# Patient Record
Sex: Male | Born: 1960 | Race: White | Hispanic: No | Marital: Married | Smoking: Former smoker
Health system: Southern US, Community
[De-identification: ages and names within clinical notes are randomized; demographics above are authoritative.]

## PROBLEM LIST (undated history)

## (undated) DIAGNOSIS — Z973 Presence of spectacles and contact lenses: Secondary | ICD-10-CM

## (undated) DIAGNOSIS — M1990 Unspecified osteoarthritis, unspecified site: Secondary | ICD-10-CM

## (undated) HISTORY — PX: COLONOSCOPY: SHX174

---

## 1986-08-21 HISTORY — PX: BURR HOLE FOR SUBDURAL HEMATOMA: SHX1275

## 1986-08-21 HISTORY — PX: ORIF TIBIA FRACTURE: SHX5416

## 1998-03-19 ENCOUNTER — Emergency Department (HOSPITAL_COMMUNITY): Admission: EM | Admit: 1998-03-19 | Discharge: 1998-03-19 | Payer: Self-pay | Admitting: Endocrinology

## 2013-10-31 ENCOUNTER — Other Ambulatory Visit: Payer: Self-pay | Admitting: Physician Assistant

## 2013-11-04 ENCOUNTER — Encounter (HOSPITAL_BASED_OUTPATIENT_CLINIC_OR_DEPARTMENT_OTHER): Payer: Self-pay | Admitting: *Deleted

## 2013-11-04 NOTE — Progress Notes (Signed)
No health issues-no meds

## 2013-11-07 ENCOUNTER — Encounter (HOSPITAL_BASED_OUTPATIENT_CLINIC_OR_DEPARTMENT_OTHER)
Admission: RE | Disposition: A | Discharge status: Home / Self Care | Payer: Self-pay | Source: Ambulatory Visit | Attending: Orthopedic Surgery

## 2013-11-07 ENCOUNTER — Ambulatory Visit (HOSPITAL_BASED_OUTPATIENT_CLINIC_OR_DEPARTMENT_OTHER)
Admission: RE | Admit: 2013-11-07 | Discharge: 2013-11-07 | Disposition: A | Discharge status: Home / Self Care | Payer: 59 | Source: Ambulatory Visit | Attending: Orthopedic Surgery | Admitting: Orthopedic Surgery

## 2013-11-07 ENCOUNTER — Ambulatory Visit (HOSPITAL_BASED_OUTPATIENT_CLINIC_OR_DEPARTMENT_OTHER): Payer: 59 | Admitting: Anesthesiology

## 2013-11-07 ENCOUNTER — Encounter (HOSPITAL_BASED_OUTPATIENT_CLINIC_OR_DEPARTMENT_OTHER): Payer: 59 | Admitting: Anesthesiology

## 2013-11-07 ENCOUNTER — Encounter (HOSPITAL_BASED_OUTPATIENT_CLINIC_OR_DEPARTMENT_OTHER): Payer: Self-pay | Admitting: *Deleted

## 2013-11-07 DIAGNOSIS — M67919 Unspecified disorder of synovium and tendon, unspecified shoulder: Secondary | ICD-10-CM | POA: Insufficient documentation

## 2013-11-07 DIAGNOSIS — Z87891 Personal history of nicotine dependence: Secondary | ICD-10-CM | POA: Insufficient documentation

## 2013-11-07 DIAGNOSIS — M19019 Primary osteoarthritis, unspecified shoulder: Secondary | ICD-10-CM | POA: Insufficient documentation

## 2013-11-07 DIAGNOSIS — M719 Bursopathy, unspecified: Secondary | ICD-10-CM | POA: Insufficient documentation

## 2013-11-07 DIAGNOSIS — S43439A Superior glenoid labrum lesion of unspecified shoulder, initial encounter: Secondary | ICD-10-CM | POA: Insufficient documentation

## 2013-11-07 DIAGNOSIS — X58XXXA Exposure to other specified factors, initial encounter: Secondary | ICD-10-CM | POA: Insufficient documentation

## 2013-11-07 HISTORY — DX: Presence of spectacles and contact lenses: Z97.3

## 2013-11-07 HISTORY — DX: Unspecified osteoarthritis, unspecified site: M19.90

## 2013-11-07 HISTORY — PX: SHOULDER ARTHROSCOPY WITH SUBACROMIAL DECOMPRESSION AND BICEP TENDON REPAIR: SHX5689

## 2013-11-07 LAB — POCT HEMOGLOBIN-HEMACUE: Hemoglobin: 14.8 g/dL (ref 13.0–17.0)

## 2013-11-07 SURGERY — SHOULDER ARTHROSCOPY WITH SUBACROMIAL DECOMPRESSION AND BICEP TENDON REPAIR
Anesthesia: General | Site: Shoulder | Laterality: Right

## 2013-11-07 MED ORDER — ONDANSETRON HCL 4 MG PO TABS: 4.0000 mg | ORAL_TABLET | Freq: Three times a day (TID) | ORAL | Status: DC | PRN

## 2013-11-07 MED ORDER — BUPIVACAINE-EPINEPHRINE PF 0.5-1:200000 % IJ SOLN
INTRAMUSCULAR | Status: DC | PRN
  Administered 2013-11-07: 25 mL

## 2013-11-07 MED ORDER — PROPOFOL 10 MG/ML IV BOLUS
INTRAVENOUS | Status: DC | PRN
  Administered 2013-11-07: 160 mg via INTRAVENOUS

## 2013-11-07 MED ORDER — OXYCODONE HCL 5 MG PO TABS: 10.0000 mg | ORAL_TABLET | ORAL | Status: DC | PRN

## 2013-11-07 MED ORDER — FENTANYL CITRATE 0.05 MG/ML IJ SOLN
50.0000 ug | INTRAMUSCULAR | Status: DC | PRN
  Administered 2013-11-07: 100 ug via INTRAVENOUS

## 2013-11-07 MED ORDER — LACTATED RINGERS IV SOLN
INTRAVENOUS | Status: DC
  Administered 2013-11-07: 13:00:00 via INTRAVENOUS

## 2013-11-07 MED ORDER — ONDANSETRON HCL 4 MG/2ML IJ SOLN
INTRAMUSCULAR | Status: DC | PRN
  Administered 2013-11-07: 4 mg via INTRAVENOUS

## 2013-11-07 MED ORDER — LACTATED RINGERS IV SOLN: INTRAVENOUS | Status: DC

## 2013-11-07 MED ORDER — MIDAZOLAM HCL 2 MG/2ML IJ SOLN: 1.0000 mg | INTRAMUSCULAR | Status: DC | PRN

## 2013-11-07 MED ORDER — GLYCOPYRROLATE 0.2 MG/ML IJ SOLN
INTRAMUSCULAR | Status: DC | PRN
  Administered 2013-11-07: 0.2 mg via INTRAVENOUS

## 2013-11-07 MED ORDER — HYDROMORPHONE HCL PF 1 MG/ML IJ SOLN: 0.2500 mg | INTRAMUSCULAR | Status: DC | PRN

## 2013-11-07 MED ORDER — OXYCODONE HCL 5 MG PO TABS: 5.0000 mg | ORAL_TABLET | Freq: Once | ORAL | Status: DC | PRN

## 2013-11-07 MED ORDER — DOCUSATE SODIUM 100 MG PO CAPS: 100.0000 mg | ORAL_CAPSULE | Freq: Two times a day (BID) | ORAL | Status: DC

## 2013-11-07 MED ORDER — MIDAZOLAM HCL 2 MG/2ML IJ SOLN
INTRAMUSCULAR | Status: AC
  Filled 2013-11-07: qty 2

## 2013-11-07 MED ORDER — LIDOCAINE HCL (CARDIAC) 20 MG/ML IV SOLN
INTRAVENOUS | Status: DC | PRN
  Administered 2013-11-07: 80 mg via INTRAVENOUS

## 2013-11-07 MED ORDER — CEFAZOLIN SODIUM-DEXTROSE 2-3 GM-% IV SOLR
2.0000 g | INTRAVENOUS | Status: AC
  Administered 2013-11-07: 2 g via INTRAVENOUS

## 2013-11-07 MED ORDER — CHLORHEXIDINE GLUCONATE 4 % EX LIQD: 60.0000 mL | Freq: Once | CUTANEOUS | Status: DC

## 2013-11-07 MED ORDER — DEXAMETHASONE SODIUM PHOSPHATE 4 MG/ML IJ SOLN
INTRAMUSCULAR | Status: DC | PRN
  Administered 2013-11-07: 5 mg via INTRAVENOUS

## 2013-11-07 MED ORDER — DEXAMETHASONE SODIUM PHOSPHATE 10 MG/ML IJ SOLN
INTRAMUSCULAR | Status: DC | PRN
  Administered 2013-11-07: 4 mg

## 2013-11-07 MED ORDER — FENTANYL CITRATE 0.05 MG/ML IJ SOLN: 50.0000 ug | Freq: Once | INTRAMUSCULAR | Status: DC

## 2013-11-07 MED ORDER — OXYCODONE HCL 5 MG/5ML PO SOLN
5.0000 mg | Freq: Once | ORAL | Status: DC | PRN
Start: 2013-11-07 — End: 2013-11-07

## 2013-11-07 MED ORDER — SODIUM CHLORIDE 0.9 % IR SOLN
Status: DC | PRN
  Administered 2013-11-07: 8000 mL

## 2013-11-07 MED ORDER — FENTANYL CITRATE 0.05 MG/ML IJ SOLN
INTRAMUSCULAR | Status: AC
  Filled 2013-11-07: qty 2

## 2013-11-07 MED ORDER — SUCCINYLCHOLINE CHLORIDE 20 MG/ML IJ SOLN
INTRAMUSCULAR | Status: DC | PRN
  Administered 2013-11-07: 100 mg via INTRAVENOUS

## 2013-11-07 MED ORDER — ASPIRIN 81 MG PO TABS: 81.0000 mg | ORAL_TABLET | Freq: Every day | ORAL | Status: DC

## 2013-11-07 MED ORDER — MIDAZOLAM HCL 2 MG/2ML IJ SOLN
1.0000 mg | INTRAMUSCULAR | Status: DC | PRN
  Administered 2013-11-07: 2 mg via INTRAVENOUS

## 2013-11-07 MED ORDER — PROMETHAZINE HCL 25 MG/ML IJ SOLN: 6.2500 mg | INTRAMUSCULAR | Status: DC | PRN

## 2013-11-07 SURGICAL SUPPLY — 74 items
APL SKNCLS STERI-STRIP NONHPOA (GAUZE/BANDAGES/DRESSINGS)
BENZOIN TINCTURE PRP APPL 2/3 (GAUZE/BANDAGES/DRESSINGS) IMPLANT
BLADE AVERAGE 25MMX9MM (BLADE)
BLADE AVERAGE 25X9 (BLADE) IMPLANT
BLADE CUTTER GATOR 3.5 (BLADE) ×3 IMPLANT
BLADE CUTTER MENIS 5.5 (BLADE) IMPLANT
BLADE GREAT WHITE 4.2 (BLADE) IMPLANT
BLADE GREAT WHITE 4.2MM (BLADE)
BLADE SURG 15 STRL LF DISP TIS (BLADE) IMPLANT
BLADE SURG 15 STRL SS (BLADE)
BUR OVAL 4.0 (BURR) IMPLANT
BUR OVAL 6.0 (BURR) ×3 IMPLANT
CANISTER SUCT 3000ML (MISCELLANEOUS) IMPLANT
CANNULA 5.75X71 LONG (CANNULA) ×3 IMPLANT
CANNULA TWIST IN 8.25X7CM (CANNULA) ×3 IMPLANT
CLOSURE WOUND 1/2 X4 (GAUZE/BANDAGES/DRESSINGS)
DRAPE STERI 35X30 U-POUCH (DRAPES) ×3 IMPLANT
DRAPE U 20/CS (DRAPES) ×3 IMPLANT
DRAPE U-SHAPE 47X51 STRL (DRAPES) ×3 IMPLANT
DRAPE U-SHAPE 76X120 STRL (DRAPES) ×6 IMPLANT
DRSG EMULSION OIL 3X3 NADH (GAUZE/BANDAGES/DRESSINGS) ×3 IMPLANT
DRSG PAD ABDOMINAL 8X10 ST (GAUZE/BANDAGES/DRESSINGS) ×3 IMPLANT
DURAPREP 26ML APPLICATOR (WOUND CARE) ×3 IMPLANT
ELECT NDL TIP 2.8 STRL (NEEDLE) IMPLANT
ELECT NEEDLE TIP 2.8 STRL (NEEDLE) IMPLANT
ELECT REM PT RETURN 9FT ADLT (ELECTROSURGICAL)
ELECTRODE REM PT RTRN 9FT ADLT (ELECTROSURGICAL) IMPLANT
GLOVE BIO SURGEON STRL SZ7.5 (GLOVE) ×3 IMPLANT
GLOVE BIO SURGEON STRL SZ8 (GLOVE) IMPLANT
GLOVE BIOGEL PI IND STRL 8 (GLOVE) ×2 IMPLANT
GLOVE BIOGEL PI INDICATOR 8 (GLOVE) ×4
GOWN STRL REUS W/ TWL LRG LVL3 (GOWN DISPOSABLE) ×3 IMPLANT
GOWN STRL REUS W/ TWL XL LVL3 (GOWN DISPOSABLE) ×1 IMPLANT
GOWN STRL REUS W/TWL LRG LVL3 (GOWN DISPOSABLE) ×9
GOWN STRL REUS W/TWL XL LVL3 (GOWN DISPOSABLE) ×3
MANIFOLD NEPTUNE II (INSTRUMENTS) ×3 IMPLANT
NDL SCORPION MULTI FIRE (NEEDLE) IMPLANT
NDL SUT 6 .5 CRC .975X.05 MAYO (NEEDLE) IMPLANT
NEEDLE MAYO TAPER (NEEDLE)
NEEDLE SCORPION MULTI FIRE (NEEDLE) IMPLANT
NS IRRIG 1000ML POUR BTL (IV SOLUTION) IMPLANT
PACK ARTHROSCOPY DSU (CUSTOM PROCEDURE TRAY) ×3 IMPLANT
PACK BASIN DAY SURGERY FS (CUSTOM PROCEDURE TRAY) ×3 IMPLANT
PENCIL BUTTON HOLSTER BLD 10FT (ELECTRODE) ×3 IMPLANT
SET ARTHROSCOPY TUBING (MISCELLANEOUS) ×3
SET ARTHROSCOPY TUBING LN (MISCELLANEOUS) ×1 IMPLANT
SLEEVE SCD COMPRESS KNEE MED (MISCELLANEOUS) ×3 IMPLANT
SLING ARM IMMOBILIZER LRG (SOFTGOODS) IMPLANT
SLING ARM IMMOBILIZER MED (SOFTGOODS) IMPLANT
SLING ARM LRG ADULT FOAM STRAP (SOFTGOODS) IMPLANT
SLING ARM MED ADULT FOAM STRAP (SOFTGOODS) IMPLANT
SLING ARM XL FOAM STRAP (SOFTGOODS) IMPLANT
SPONGE GAUZE 4X4 12PLY (GAUZE/BANDAGES/DRESSINGS) ×6 IMPLANT
SPONGE GAUZE 4X4 12PLY STER LF (GAUZE/BANDAGES/DRESSINGS) ×2 IMPLANT
SPONGE LAP 4X18 X RAY DECT (DISPOSABLE) IMPLANT
STRIP CLOSURE SKIN 1/2X4 (GAUZE/BANDAGES/DRESSINGS) IMPLANT
SUCTION FRAZIER TIP 10 FR DISP (SUCTIONS) IMPLANT
SUT ETHIBOND 2 OS 4 DA (SUTURE) IMPLANT
SUT ETHILON 2 0 FS 18 (SUTURE) IMPLANT
SUT ETHILON 3 0 PS 1 (SUTURE) ×3 IMPLANT
SUT FIBERWIRE #2 38 T-5 BLUE (SUTURE)
SUT TIGER TAPE 7 IN WHITE (SUTURE) IMPLANT
SUT VIC AB 0 CT1 27 (SUTURE)
SUT VIC AB 0 CT1 27XBRD ANBCTR (SUTURE) IMPLANT
SUT VIC AB 2-0 SH 27 (SUTURE)
SUT VIC AB 2-0 SH 27XBRD (SUTURE) IMPLANT
SUT VIC AB 3-0 FS2 27 (SUTURE) IMPLANT
SUTURE FIBERWR #2 38 T-5 BLUE (SUTURE) IMPLANT
TAPE FIBER 2MM 7IN #2 BLUE (SUTURE) IMPLANT
TOWEL OR 17X24 6PK STRL BLUE (TOWEL DISPOSABLE) ×3 IMPLANT
TOWEL OR NON WOVEN STRL DISP B (DISPOSABLE) ×3 IMPLANT
WAND STAR VAC 90 (SURGICAL WAND) ×3 IMPLANT
WATER STERILE IRR 1000ML POUR (IV SOLUTION) ×3 IMPLANT
YANKAUER SUCT BULB TIP NO VENT (SUCTIONS) IMPLANT

## 2013-11-07 NOTE — Discharge Instructions (Signed)
Take ASA 81mg  daily for 30 days  Use your sling for comfort  Remove your dressing on Monday and replace with bandaid's. You may shower on Monday  Discharge Instructions After Orthopedic Procedures:  *You may feel tired and weak following your procedure. It is recommended that you limit physical activity for the next 24 hours and rest at home for the remainder of today and tomorrow. *No strenuous activity should be started without your doctor's permission.  Elevate the extremity that you had surgery on to a level above your heart. This should continue for 48 hours or as instructed by your doctor.  If you had hand, arm or shoulder surgery you should move your fingers frequently unless otherwise instructed by your doctor.  If you had foot, knee or leg surgery you should wiggle your toes frequently unless otherwise instructed by your doctor.  Follow your doctor's exact instructions for activity at home. Use your home equipment as instructed. (Crutches, hard shoes, slings etc.)  Limit your activity as instructed by your doctor.  Report to your doctor should any of the following occur: 1. Extreme swelling of your fingers or toes. 2. Inability to wiggle your fingers or toes. 3. Coldness, pale or bluish color in your fingers or toes. 4. Loss of sensation, numbness or tingling of your fingers or toes. 5. Unusual smell or odor from under your dressing or cast. 6. Excessive bleeding or drainage from the surgical site. 7. Pain not relieved by medication your doctor has prescribed for you. 8. Cast or dressing too tight (do not get your dressing or cast wet or put anything under          your dressing or cast.)  *Do not change your dressing unless instructed by your doctor or discharge nurse. Then follow exact instructions.  *Follow labeled instructions for any medications that your doctor may have prescribed for you. *Should any questions or complications develop following your procedure, PLEASE  CONTACT YOUR DOCTOR.  Regional Anesthesia Blocks  1. Numbness or the inability to move the "blocked" extremity may last from 3-48 hours after placement. The length of time depends on the medication injected and your individual response to the medication. If the numbness is not going away after 48 hours, call your surgeon.  2. The extremity that is blocked will need to be protected until the numbness is gone and the  Strength has returned. Because you cannot feel it, you will need to take extra care to avoid injury. Because it may be weak, you may have difficulty moving it or using it. You may not know what position it is in without looking at it while the block is in effect.  3. For blocks in the legs and feet, returning to weight bearing and walking needs to be done carefully. You will need to wait until the numbness is entirely gone and the strength has returned. You should be able to move your leg and foot normally before you try and bear weight or walk. You will need someone to be with you when you first try to ensure you do not fall and possibly risk injury.  4. Bruising and tenderness at the needle site are common side effects and will resolve in a few days.  5. Persistent numbness or new problems with movement should be communicated to the surgeon or the [facility] [phone] [facility] [phone]).  Post Anesthesia Home Care Instructions  Activity: Get plenty of rest for the remainder of the  day. A responsible adult should stay with you for 24 hours following the procedure.  For the next 24 hours, DO NOT: -Drive a car -Advertising copywriterperate machinery -Drink alcoholic beverages -Take any medication unless instructed by your physician -Make any legal decisions or sign important papers.  Meals: Start with liquid foods such as gelatin or soup. Progress to regular foods as tolerated. Avoid greasy, spicy, heavy foods. If nausea and/or vomiting occur, drink only clear  liquids until the nausea and/or vomiting subsides. Call your physician if vomiting continues.  Special Instructions/Symptoms: Your throat may feel dry or sore from the anesthesia or the breathing tube placed in your throat during surgery. If this causes discomfort, gargle with warm salt water. The discomfort should disappear within 24 hours.

## 2013-11-07 NOTE — Anesthesia Postprocedure Evaluation (Signed)
  Anesthesia Post-op Note  Patient: Shawn Lane  Procedure(s) Performed: Procedure(s): RIGHT SHOULDER ARTHROSCOPY DEBRIDEMENT EXTENSIVE WITH SUBACROMIAL DECOMPRESSION PARTIAL ACROMIOPLASTY ,DISTAL CLAVICLE EXCISION CORACPACROMIAL RELEASE, DISTAL CLAVICULECTOMY,AND BICEP TENODESIS (Right)  Patient Location: PACU  Anesthesia Type:GA combined with regional for post-op pain  Level of Consciousness: awake and alert   Airway and Oxygen Therapy: Patient Spontanous Breathing  Post-op Pain: none  Post-op Assessment: Post-op Vital signs reviewed  Post-op Vital Signs: stable  Complications: No apparent anesthesia complications

## 2013-11-07 NOTE — Anesthesia Preprocedure Evaluation (Addendum)
Anesthesia Evaluation  Patient identified by MRN, date of birth, ID band Patient awake    Reviewed: Allergy & Precautions, H&P , NPO status , Patient's Chart, lab work & pertinent test results  Airway Mallampati: I TM Distance: >3 FB Neck ROM: Full    Dental   Pulmonary former smoker,  breath sounds clear to auscultation        Cardiovascular Rhythm:Regular Rate:Normal     Neuro/Psych    GI/Hepatic   Endo/Other    Renal/GU      Musculoskeletal   Abdominal   Peds  Hematology   Anesthesia Other Findings   Reproductive/Obstetrics                          Anesthesia Physical Anesthesia Plan  ASA: II  Anesthesia Plan: General   Post-op Pain Management:    Induction: Intravenous  Airway Management Planned: Oral ETT  Additional Equipment:   Intra-op Plan:   Post-operative Plan: Extubation in OR  Informed Consent: I have reviewed the patients History and Physical, chart, labs and discussed the procedure including the risks, benefits and alternatives for the proposed anesthesia with the patient or authorized representative who has indicated his/her understanding and acceptance.     Plan Discussed with: CRNA and Surgeon  Anesthesia Plan Comments:         Anesthesia Quick Evaluation

## 2013-11-07 NOTE — Op Note (Signed)
11/07/2013  2:49 PM  PATIENT:  Shawn Lane [place]    PRE-OPERATIVE DIAGNOSIS:  RIGHT SHOULDER DISORDER ARTICULAR CARTILAGE, DEGENERATIVE ARTHRITIS, DISORDERS OF BURSAE AND TENDONS IN SHOULDER REGION, UNSPECIFIED, BICIPTAL  POST-OPERATIVE DIAGNOSIS:  Same  PROCEDURE:  RIGHT SHOULDER ARTHROSCOPY DEBRIDEMENT EXTENSIVE WITH SUBACROMIAL DECOMPRESSION PARTIAL ACROMIOPLASTY WITH CORACPACROMIAL RELEASE, DISTAL CLAVICULECTOMY,AND BICEP TENODESIS  SURGEON:  Madigan Rosensteel, D, MD  ASSISTANT: Janace LittenBrandon Parry OPA  ANESTHESIA:   General  PREOPERATIVE INDICATIONS:  Shawn Lane Sabia is Lane  53 y.o. male with Lane diagnosis of RIGHT SHOULDER DISORDER ARTICULAR CARTILAGE, DEGENERATIVE ARTHRITIS, DISORDERS OF BURSAE AND TENDONS IN SHOULDER REGION, UNSPECIFIED, BICIPTAL who failed conservative measures and elected for surgical management.    The risks benefits and alternatives were discussed with the patient preoperatively including but not limited to the risks of infection, bleeding, nerve injury, cardiopulmonary complications, the need for revision surgery, among others, and the patient was willing to proceed.  OPERATIVE IMPLANTS: none  OPERATIVE FINDINGS: Limited range of motion to 50 and 90 of forward elevation improved after manipulation. Grade 1/2 SLAP tear. Significant clavicle overgrowth at the Lane.c. joint. No arthritic changes at the glenohumeral joint. Cuff intact. Post acromion.  BLOOD LOSS: minimal  COMPLICATIONS: none  OPERATIVE PROCEDURE:  Patient was identified in the preoperative holding area and site was marked by me He was transported to the operating theater and placed on the table in beach chair position taking care to pad all bony prominences. After Lane preincinduction time out anesthesia was induced. The right upper extremity was prepped and draped in normal sterile fashion and Lane pre-incision timeout was performed. Shawn Lane Verba received ancef for preoperative antibiotics.   I performed Lane firm but  gentle manipulation under anesthesia and had palpable breakup of scar tissue and improvement in range of motion.  Initially made Lane posterior arthroscopic portal and inserted the arthroscope into the glenohumeral joint. tour of the joint demonstrated the above operative findings  I created an anterior portal just lateral to the coracoid under direct visualization using Lane spinal needle.   I used Lane combination of biter and shaver to release the biceps tendon from the superior labrum and then used the shaver to debride the superior labrum to Lane smooth rim.  I then introduced the arthroscope into the subacromial space and brought the shaver into the anterior portal. I debrided the bursa for appropriate visualization.  I then performed Lane subacromial decompression using combination of the shaver ArthroCare and burr using Lane cutting block technique. As happy with the final elevation of the subacromial space on multiple portal views.  Next I turned my attention to the distal clavicle and through the anterior portal using the bur and shaver I was able to perform Lane distal clavicle excision. I then switched portals and inserted the arthroscope into the anterior portal and was happy with an appropriate resection of the distal clavicle.  Next I removed all arthroscopic equipment expressed all fluid and closed the portals with Lane nylon stitch. Lane sterile dressing was applied the patient was taken the PACU in stable condition.  POST OPERATIVE PLAN: The patient will be in Lane sling full-time and keep the dressings clean dry and intact. DVT prophylaxis will consist of early ambulation and ASA 81mg 

## 2013-11-07 NOTE — Progress Notes (Signed)
Assisted Dr. Kasik with right, ultrasound guided, interscalene  block. Side rails up, monitors on throughout procedure. See vital signs in flow sheet. Tolerated Procedure well. 
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
## 2013-11-07 NOTE — Interval H&P Note (Signed)
History and Physical Interval Note:  11/07/2013 12:54 PM  Shawn Lane  has presented today for surgery, with the diagnosis of RIGHT SHOULDER DISORDER ARTICULAR CARTILAGE, DEGENERATIVE ARTHRITIS, DISORDERS OF BURSAE AND TENDONS IN SHOULDER REGION, UNSPECIFIED, BICIPTAL  The various methods of treatment have been discussed with the patient and family. After consideration of risks, benefits and other options for treatment, the patient has consented to  Procedure(s): RIGHT SHOULDER ARTHROSCOPY DEBRIDEMENT EXTENSIVE WITH SUBACROMIAL DECOMPRESSION PARTIAL ACROMIOPLASTY WITH CORACPACROMIAL RELEASE, DISTAL CLAVICULECTOMY,AND BICEP TENODESIS (Right) as a surgical intervention .  The patient's history has been reviewed, patient examined, no change in status, stable for surgery.  I have reviewed the patient's chart and labs.  Questions were answered to the patient's satisfaction.     Lakrisha Iseman, D

## 2013-11-07 NOTE — Anesthesia Procedure Notes (Addendum)
Anesthesia Regional Block:  Interscalene brachial plexus block  Pre-Anesthetic Checklist: ,, timeout performed, Correct Patient, Correct Site, Correct Laterality, Correct Procedure, Correct Position, site marked, Risks and benefits discussed,  Surgical consent,  Pre-op evaluation,  At surgeon's request and post-op pain management  Laterality: Right  Prep: chloraprep       Needles:  Injection technique: Single-shot  Needle Type: Echogenic Stimulator Needle     Needle Length: 5cm 5 cm Needle Gauge: 22 and 22 G    Additional Needles:  Procedures: ultrasound guided (picture in chart) and nerve stimulator Interscalene brachial plexus block  Nerve Stimulator or Paresthesia:  Response: 0.5 mA,   Additional Responses:   Narrative:  Start time: 11/07/2013 12:57 PM End time: 11/07/2013 1:10 PM Injection made incrementally with aspirations every 5 mL. Anesthesiologist: Dr Gypsy BalsamKasik  Additional Notes: 1257-1310 R ISB POP CHG prep, sterile tech #22 stim/echo needle with stim down to .5ma and good US visualization-PIX in chart Multiple neg asp  Vernia BuffMarc .5% w/epi 1:200000 total 25cc+decadron 4mg  infiltrated No compl Dr Gypsy BalsamKasik   Procedure Name: Intubation Date/Time: 11/07/2013 1:54 PM Performed by: Burna CashONRAD, Caera Enwright C Pre-anesthesia Checklist: Patient identified, Emergency Drugs available, Suction available and Patient being monitored Patient Re-evaluated:Patient Re-evaluated prior to inductionOxygen Delivery Method: Circle System Utilized Preoxygenation: Pre-oxygenation with 100% oxygen Intubation Type: IV induction Ventilation: Mask ventilation without difficulty Laryngoscope Size: Mac and 3 Grade View: Grade I Tube type: Oral Tube size: 8.0 mm Number of attempts: 1 Airway Equipment and Method: stylet and oral airway Placement Confirmation: ETT inserted through vocal cords under direct vision,  positive ETCO2 and breath sounds checked- equal and bilateral Secured at: 22 cm Tube  secured with: Tape Dental Injury: Teeth and Oropharynx as per pre-operative assessment

## 2013-11-07 NOTE — Transfer of Care (Signed)
Immediate Anesthesia Transfer of Care Note  Patient: Shawn Lane  Procedure(s) Performed: Procedure(s): RIGHT SHOULDER ARTHROSCOPY DEBRIDEMENT EXTENSIVE WITH SUBACROMIAL DECOMPRESSION PARTIAL ACROMIOPLASTY ,DISTAL CLAVICLE EXCISION CORACPACROMIAL RELEASE, DISTAL CLAVICULECTOMY,AND BICEP TENODESIS (Right)  Patient Location: PACU  Anesthesia Type:GA combined with regional for post-op pain  Level of Consciousness: awake, sedated and patient cooperative  Airway & Oxygen Therapy: Patient Spontanous Breathing and Patient connected to face mask oxygen  Post-op Assessment: Report given to PACU RN and Post -op Vital signs reviewed and stable  Post vital signs: Reviewed and stable  Complications: No apparent anesthesia complications

## 2013-11-10 ENCOUNTER — Encounter (HOSPITAL_BASED_OUTPATIENT_CLINIC_OR_DEPARTMENT_OTHER): Payer: Self-pay | Admitting: Orthopedic Surgery

## 2015-10-20 ENCOUNTER — Other Ambulatory Visit: Payer: Self-pay | Admitting: Family Medicine

## 2015-10-20 DIAGNOSIS — R109 Unspecified abdominal pain: Secondary | ICD-10-CM

## 2015-10-21 ENCOUNTER — Ambulatory Visit
Admission: RE | Admit: 2015-10-21 | Discharge: 2015-10-21 | Disposition: A | Discharge status: Home / Self Care | Payer: Self-pay | Source: Ambulatory Visit | Attending: Family Medicine | Admitting: Family Medicine

## 2015-10-21 DIAGNOSIS — R109 Unspecified abdominal pain: Secondary | ICD-10-CM

## 2017-03-20 ENCOUNTER — Ambulatory Visit (INDEPENDENT_AMBULATORY_CARE_PROVIDER_SITE_OTHER): Payer: 59 | Admitting: Allergy and Immunology

## 2017-03-20 ENCOUNTER — Encounter: Payer: Self-pay | Admitting: Allergy and Immunology

## 2017-03-20 DIAGNOSIS — J3089 Other allergic rhinitis: Secondary | ICD-10-CM

## 2017-03-20 DIAGNOSIS — Z91018 Allergy to other foods: Secondary | ICD-10-CM | POA: Diagnosis not present

## 2017-03-20 MED ORDER — FLUTICASONE PROPIONATE 93 MCG/ACT NA EXHU: 2.0000 | INHALANT_SUSPENSION | Freq: Two times a day (BID) | NASAL | 5 refills | Status: AC

## 2017-03-20 MED ORDER — LEVOCETIRIZINE DIHYDROCHLORIDE 5 MG PO TABS
5.0000 mg | ORAL_TABLET | Freq: Every evening | ORAL | 5 refills | Status: AC
Start: 2017-03-20 — End: ?

## 2017-03-20 NOTE — Patient Instructions (Addendum)
Allergic rhinitis Perennial and seasonal allergic rhinitis with a possible nonallergic component.  Aeroallergen avoidance measures have been discussed and provided in written form.  For runny nose and sneezing A prescription has been provided for levocetirizine, 5mg  daily as needed.  [facility], 2 actuations per nostril twice a day. Proper technique has been discussed and demonstrated.  Nasal saline lavage (NeilMed) has been recommended as needed and prior to medicated nasal sprays along with instructions for proper administration.  For thick post nasal drainage, add guaifenesin 1200 mg (Mucinex Maximum Strength)  twice daily as needed with adequate hydration as discussed.  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   Should symptoms progress beyond nasal congestion, a journal is to be kept recording any foods eaten, beverages consumed, medications taken, activities performed, and environmental conditions within a 6 hour time period prior to the onset of symptoms. For any symptoms concerning for anaphylaxis, 911 is to be called immediately.    Return in about 4 months (around 07/20/2017), or if symptoms worsen or fail to improve.   Reducing Pollen Exposure  The American Academy of Allergy, Asthma and Immunology suggests the following steps to reduce your exposure to pollen during allergy seasons.    1. Do not hang sheets or clothing out to dry; pollen may collect on these items. 2. Do not mow lawns or spend time around freshly cut grass; mowing stirs up pollen. 3. Keep windows closed at night.  Keep car windows closed while driving. 4. Minimize morning activities outdoors, a time when pollen counts are usually at their highest. 5. Stay indoors as much as possible when pollen counts or humidity is high and on windy days when pollen tends to remain in the air longer. 6. Use air conditioning  when possible.  Many air conditioners have filters that trap the pollen spores. 7. Use a HEPA room air filter to remove pollen form the indoor air you breathe.    Control of Mold Allergen  Mold and fungi can grow on a variety of surfaces provided certain temperature and moisture conditions exist.  Outdoor molds grow on plants, decaying vegetation and soil.  The major outdoor mold, Alternaria and Cladosporium, are found in very high numbers during hot and dry conditions.  Generally, a late Summer - Fall peak is seen for common outdoor fungal spores.  Rain will temporarily lower outdoor mold spore count, but counts rise rapidly when the rainy period ends.  The most important indoor molds are Aspergillus and Penicillium.  Dark, humid and poorly ventilated basements are ideal sites for mold growth.  The next most common sites of mold growth are the bathroom and the kitchen.  Outdoor MicrosoftMold Control 1. Use air conditioning and keep windows closed 2. Avoid exposure to decaying vegetation. 3. Avoid leaf raking. 4. Avoid grain handling. 5. Consider wearing a face mask if working in moldy areas.  Indoor Mold Control 1. Maintain humidity below 50%. 2. Clean washable surfaces with 5% bleach solution. 3. Remove sources e.g. Contaminated carpets.  Control of Dog or Cat Allergen  Avoidance is the best way to manage a dog or cat allergy. If you have a dog or cat and are allergic to dog or cats, consider removing the dog or cat from the home. If you have a dog or cat but don't want to find it a new home, or if your family wants a pet even though someone in  the household is allergic, here are some strategies that may help keep symptoms at bay:  1. Keep the pet out of your bedroom and restrict it to only a few rooms. Be advised that keeping the dog or cat in only one room will not limit the allergens to that room. 2. Don't pet, hug or kiss the dog or cat; if you do, wash your hands with soap and  water. 3. High-efficiency particulate air (HEPA) cleaners run continuously in a bedroom or living room can reduce allergen levels over time. 4. Place electrostatic material sheet in the air inlet vent in the bedroom. 5. Regular use of a high-efficiency vacuum cleaner or a central vacuum can reduce allergen levels. 6. Giving your dog or cat a bath at least once a week can reduce airborne allergen.

## 2017-03-20 NOTE — Assessment & Plan Note (Signed)
Perennial and seasonal allergic rhinitis with a possible nonallergic component.  Aeroallergen avoidance measures have been discussed and provided in written form.  For runny nose and sneezing A prescription has been provided for levocetirizine, 5mg  daily as needed.  [facility], 2 actuations per nostril twice a day. Proper technique has been discussed and demonstrated.  Nasal saline lavage (NeilMed) has been recommended as needed and prior to medicated nasal sprays along with instructions for proper administration.  For thick post nasal drainage, add guaifenesin 1200 mg (Mucinex Maximum Strength)  twice daily as needed with adequate hydration as discussed.

## 2017-03-20 NOTE — Assessment & Plan Note (Signed)
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   Should symptoms progress beyond nasal congestion, a journal is to be kept recording any foods eaten, beverages consumed, medications taken, activities performed, and environmental conditions within a 6 hour time period prior to the onset of symptoms. For any symptoms concerning for anaphylaxis, 911 is to be called immediately.
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
## 2017-07-30 ENCOUNTER — Ambulatory Visit: Payer: 59 | Admitting: Allergy and Immunology

## 2021-01-03 ENCOUNTER — Other Ambulatory Visit: Payer: Self-pay

## 2021-01-03 ENCOUNTER — Encounter (HOSPITAL_COMMUNITY): Payer: Self-pay

## 2021-01-03 ENCOUNTER — Emergency Department (HOSPITAL_COMMUNITY)
Admission: EM | Admit: 2021-01-03 | Discharge: 2021-01-04 | Disposition: A | Discharge status: Home / Self Care | Payer: 59 | Source: Home / Self Care | Attending: Emergency Medicine | Admitting: Emergency Medicine

## 2021-01-03 ENCOUNTER — Ambulatory Visit (HOSPITAL_COMMUNITY)
Admission: EM | Admit: 2021-01-03 | Discharge: 2021-01-03 | Disposition: A | Discharge status: Home / Self Care | Payer: 59

## 2021-01-03 ENCOUNTER — Emergency Department (HOSPITAL_COMMUNITY): Payer: 59

## 2021-01-03 DIAGNOSIS — Z87891 Personal history of nicotine dependence: Secondary | ICD-10-CM | POA: Insufficient documentation

## 2021-01-03 DIAGNOSIS — S0990XA Unspecified injury of head, initial encounter: Secondary | ICD-10-CM | POA: Insufficient documentation

## 2021-01-03 DIAGNOSIS — Z8679 Personal history of other diseases of the circulatory system: Secondary | ICD-10-CM | POA: Diagnosis not present

## 2021-01-03 DIAGNOSIS — R519 Headache, unspecified: Secondary | ICD-10-CM

## 2021-01-03 DIAGNOSIS — X58XXXA Exposure to other specified factors, initial encounter: Secondary | ICD-10-CM | POA: Insufficient documentation

## 2021-01-03 NOTE — ED Provider Notes (Signed)
Emergency Medicine Provider Triage Evaluation Note  Shawn Lane , a 60 y.o. male  was evaluated in triage.  Pt complains of worst headache of his life x yesterday. Seen at [facility] sent in for CT Head r/o brain bleed. Prior hx of subdural   Review of Systems  Positive: Headache, photophobia, trauma  Negative: Neck pain, loss of vision, vomiting  Physical Exam  BP (!) 165/90 (BP Location: Right Arm)   Pulse 63   Temp 98.5 F (36.9 C) (Oral)   Resp 16   Ht 5' 11.5" (1.816 m)   Wt 80.3 kg   SpO2 99%   BMI 24.34 kg/m  Gen:   Awake, no distress   Resp:  Normal effort  MSK:   Moves extremities without difficulty  Other:  Moves all upper and lower extremities. No dysarthria, no facial asymmetry. Sensation is intact throughout, normal gait.     Medical Decision Making  Medically screening exam initiated at 9:34 PM.  Appropriate orders placed.  Roma Kayser was informed that the remainder of the evaluation will be completed by another provider, this initial triage assessment does not replace that evaluation, and the importance of remaining in the ED until their evaluation is complete.  Patient here with " worst of his life" headache x yesterday.  Has taken ibuprofen, tramadol x4 without improvement in symptoms.  Evaluated at urgent care, sent in for CT head to rule out brain bleed as patient has a prior history of subdural hematoma.   Claude Manges, PA-C 01/03/21 2139    Wynetta Fines, MD 01/12/2021 [phone]
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
## 2021-01-03 NOTE — ED Triage Notes (Signed)
Pt presents with neck pain and ongoing headache X 2 days; pt states he had a sparring match this weekend and took a few head shots.

## 2021-01-03 NOTE — Discharge Instructions (Addendum)
-  Head straight to [facility] emergency room for further evaluation and management of your headache.  I am concerned that you have a bleed in your brain, and this must be evaluated in the emergency room setting.  It will likely require imaging like a CT to rule this out. This can be deadly if not treated urgently. -If you become dizzy, lightheaded, weak but on the way-stop and call 911.
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
## 2021-01-04 ENCOUNTER — Inpatient Hospital Stay (HOSPITAL_COMMUNITY)
Admission: EM | Admit: 2021-01-04 | Discharge: 2021-01-19 | DRG: 023 | Disposition: E | Payer: 59 | Attending: Neurosurgery | Admitting: Neurosurgery

## 2021-01-04 ENCOUNTER — Emergency Department (HOSPITAL_COMMUNITY): Payer: 59

## 2021-01-04 ENCOUNTER — Inpatient Hospital Stay (HOSPITAL_COMMUNITY): Payer: 59

## 2021-01-04 ENCOUNTER — Encounter (HOSPITAL_COMMUNITY): Payer: Self-pay | Admitting: Emergency Medicine

## 2021-01-04 DIAGNOSIS — J9601 Acute respiratory failure with hypoxia: Secondary | ICD-10-CM | POA: Diagnosis present

## 2021-01-04 DIAGNOSIS — Z452 Encounter for adjustment and management of vascular access device: Secondary | ICD-10-CM

## 2021-01-04 DIAGNOSIS — I6051 Nontraumatic subarachnoid hemorrhage from right vertebral artery: Secondary | ICD-10-CM | POA: Diagnosis present

## 2021-01-04 DIAGNOSIS — Z66 Do not resuscitate: Secondary | ICD-10-CM | POA: Diagnosis not present

## 2021-01-04 DIAGNOSIS — I248 Other forms of acute ischemic heart disease: Secondary | ICD-10-CM | POA: Diagnosis present

## 2021-01-04 DIAGNOSIS — J810 Acute pulmonary edema: Secondary | ICD-10-CM | POA: Diagnosis not present

## 2021-01-04 DIAGNOSIS — E878 Other disorders of electrolyte and fluid balance, not elsewhere classified: Secondary | ICD-10-CM | POA: Diagnosis not present

## 2021-01-04 DIAGNOSIS — M7989 Other specified soft tissue disorders: Secondary | ICD-10-CM | POA: Diagnosis not present

## 2021-01-04 DIAGNOSIS — N179 Acute kidney failure, unspecified: Secondary | ICD-10-CM | POA: Diagnosis present

## 2021-01-04 DIAGNOSIS — W500XXA Accidental hit or strike by another person, initial encounter: Secondary | ICD-10-CM | POA: Diagnosis present

## 2021-01-04 DIAGNOSIS — E871 Hypo-osmolality and hyponatremia: Secondary | ICD-10-CM | POA: Diagnosis present

## 2021-01-04 DIAGNOSIS — J690 Pneumonitis due to inhalation of food and vomit: Secondary | ICD-10-CM | POA: Diagnosis present

## 2021-01-04 DIAGNOSIS — I4510 Unspecified right bundle-branch block: Secondary | ICD-10-CM | POA: Diagnosis present

## 2021-01-04 DIAGNOSIS — J9600 Acute respiratory failure, unspecified whether with hypoxia or hypercapnia: Secondary | ICD-10-CM

## 2021-01-04 DIAGNOSIS — J150 Pneumonia due to Klebsiella pneumoniae: Secondary | ICD-10-CM | POA: Diagnosis present

## 2021-01-04 DIAGNOSIS — J180 Bronchopneumonia, unspecified organism: Secondary | ICD-10-CM | POA: Diagnosis not present

## 2021-01-04 DIAGNOSIS — I472 Ventricular tachycardia: Secondary | ICD-10-CM

## 2021-01-04 DIAGNOSIS — E860 Dehydration: Secondary | ICD-10-CM | POA: Diagnosis present

## 2021-01-04 DIAGNOSIS — Z781 Physical restraint status: Secondary | ICD-10-CM

## 2021-01-04 DIAGNOSIS — R402212 Coma scale, best verbal response, none, at arrival to emergency department: Secondary | ICD-10-CM | POA: Diagnosis present

## 2021-01-04 DIAGNOSIS — R29818 Other symptoms and signs involving the nervous system: Secondary | ICD-10-CM

## 2021-01-04 DIAGNOSIS — G9341 Metabolic encephalopathy: Secondary | ICD-10-CM | POA: Diagnosis present

## 2021-01-04 DIAGNOSIS — G931 Anoxic brain damage, not elsewhere classified: Secondary | ICD-10-CM | POA: Diagnosis not present

## 2021-01-04 DIAGNOSIS — E870 Hyperosmolality and hypernatremia: Secondary | ICD-10-CM | POA: Diagnosis not present

## 2021-01-04 DIAGNOSIS — E872 Acidosis: Secondary | ICD-10-CM | POA: Diagnosis present

## 2021-01-04 DIAGNOSIS — I726 Aneurysm of vertebral artery: Secondary | ICD-10-CM | POA: Diagnosis present

## 2021-01-04 DIAGNOSIS — R402312 Coma scale, best motor response, none, at arrival to emergency department: Secondary | ICD-10-CM | POA: Diagnosis present

## 2021-01-04 DIAGNOSIS — G8321 Monoplegia of upper limb affecting right dominant side: Secondary | ICD-10-CM | POA: Diagnosis present

## 2021-01-04 DIAGNOSIS — Z978 Presence of other specified devices: Secondary | ICD-10-CM

## 2021-01-04 DIAGNOSIS — I609 Nontraumatic subarachnoid hemorrhage, unspecified: Secondary | ICD-10-CM | POA: Diagnosis not present

## 2021-01-04 DIAGNOSIS — Z20822 Contact with and (suspected) exposure to covid-19: Secondary | ICD-10-CM | POA: Diagnosis present

## 2021-01-04 DIAGNOSIS — J309 Allergic rhinitis, unspecified: Secondary | ICD-10-CM | POA: Diagnosis present

## 2021-01-04 DIAGNOSIS — J9690 Respiratory failure, unspecified, unspecified whether with hypoxia or hypercapnia: Secondary | ICD-10-CM

## 2021-01-04 DIAGNOSIS — I10 Essential (primary) hypertension: Secondary | ICD-10-CM | POA: Diagnosis present

## 2021-01-04 DIAGNOSIS — I469 Cardiac arrest, cause unspecified: Secondary | ICD-10-CM | POA: Diagnosis not present

## 2021-01-04 DIAGNOSIS — R402112 Coma scale, eyes open, never, at arrival to emergency department: Secondary | ICD-10-CM | POA: Diagnosis present

## 2021-01-04 DIAGNOSIS — M549 Dorsalgia, unspecified: Secondary | ICD-10-CM | POA: Diagnosis present

## 2021-01-04 DIAGNOSIS — R0902 Hypoxemia: Secondary | ICD-10-CM

## 2021-01-04 DIAGNOSIS — Z4659 Encounter for fitting and adjustment of other gastrointestinal appliance and device: Secondary | ICD-10-CM

## 2021-01-04 DIAGNOSIS — G911 Obstructive hydrocephalus: Secondary | ICD-10-CM | POA: Diagnosis present

## 2021-01-04 DIAGNOSIS — E1165 Type 2 diabetes mellitus with hyperglycemia: Secondary | ICD-10-CM | POA: Diagnosis present

## 2021-01-04 DIAGNOSIS — B961 Klebsiella pneumoniae [K. pneumoniae] as the cause of diseases classified elsewhere: Secondary | ICD-10-CM | POA: Diagnosis present

## 2021-01-04 DIAGNOSIS — J189 Pneumonia, unspecified organism: Secondary | ICD-10-CM

## 2021-01-04 DIAGNOSIS — G936 Cerebral edema: Secondary | ICD-10-CM | POA: Diagnosis present

## 2021-01-04 DIAGNOSIS — Z515 Encounter for palliative care: Secondary | ICD-10-CM

## 2021-01-04 DIAGNOSIS — Z7189 Other specified counseling: Secondary | ICD-10-CM | POA: Diagnosis not present

## 2021-01-04 DIAGNOSIS — S066X9A Traumatic subarachnoid hemorrhage with loss of consciousness of unspecified duration, initial encounter: Secondary | ICD-10-CM | POA: Diagnosis not present

## 2021-01-04 DIAGNOSIS — H5500 Unspecified nystagmus: Secondary | ICD-10-CM | POA: Diagnosis present

## 2021-01-04 DIAGNOSIS — G8929 Other chronic pain: Secondary | ICD-10-CM | POA: Diagnosis present

## 2021-01-04 DIAGNOSIS — Y9375 Activity, martial arts: Secondary | ICD-10-CM | POA: Diagnosis not present

## 2021-01-04 DIAGNOSIS — J988 Other specified respiratory disorders: Secondary | ICD-10-CM

## 2021-01-04 DIAGNOSIS — I445 Left posterior fascicular block: Secondary | ICD-10-CM | POA: Diagnosis not present

## 2021-01-04 DIAGNOSIS — Z91018 Allergy to other foods: Secondary | ICD-10-CM

## 2021-01-04 DIAGNOSIS — M1990 Unspecified osteoarthritis, unspecified site: Secondary | ICD-10-CM | POA: Diagnosis present

## 2021-01-04 DIAGNOSIS — J9602 Acute respiratory failure with hypercapnia: Secondary | ICD-10-CM | POA: Diagnosis not present

## 2021-01-04 DIAGNOSIS — R339 Retention of urine, unspecified: Secondary | ICD-10-CM | POA: Diagnosis not present

## 2021-01-04 DIAGNOSIS — Z87891 Personal history of nicotine dependence: Secondary | ICD-10-CM

## 2021-01-04 DIAGNOSIS — Z79899 Other long term (current) drug therapy: Secondary | ICD-10-CM

## 2021-01-04 DIAGNOSIS — G9340 Encephalopathy, unspecified: Secondary | ICD-10-CM | POA: Diagnosis present

## 2021-01-04 DIAGNOSIS — R410 Disorientation, unspecified: Secondary | ICD-10-CM | POA: Diagnosis not present

## 2021-01-04 DIAGNOSIS — R1110 Vomiting, unspecified: Secondary | ICD-10-CM

## 2021-01-04 DIAGNOSIS — D649 Anemia, unspecified: Secondary | ICD-10-CM | POA: Diagnosis not present

## 2021-01-04 DIAGNOSIS — I4519 Other right bundle-branch block: Secondary | ICD-10-CM | POA: Diagnosis not present

## 2021-01-04 DIAGNOSIS — T1490XA Injury, unspecified, initial encounter: Secondary | ICD-10-CM

## 2021-01-04 DIAGNOSIS — I7774 Dissection of vertebral artery: Secondary | ICD-10-CM | POA: Diagnosis not present

## 2021-01-04 LAB — TROPONIN I (HIGH SENSITIVITY)
Troponin I (High Sensitivity): 9 ng/L (ref <18)
Troponin I (High Sensitivity): 251 ng/L (ref <18)
Troponin I (High Sensitivity): 702 ng/L (ref <18)

## 2021-01-04 LAB — COMPREHENSIVE METABOLIC PANEL
ALT: 25 U/L (ref 0–44)
AST: 30 U/L (ref 15–41)
Albumin: 3.9 g/dL (ref 3.5–5.0)
Alkaline Phosphatase: 57 U/L (ref 38–126)
Anion gap: 16 — ABNORMAL HIGH (ref 5–15)
BUN: 17 mg/dL (ref 8–23)
CO2: 20 mmol/L — ABNORMAL LOW (ref 22–32)
Calcium: 8.7 mg/dL — ABNORMAL LOW (ref 8.9–10.3)
Chloride: 100 mmol/L (ref 98–111)
Creatinine, Ser: 1.64 mg/dL — ABNORMAL HIGH (ref 0.61–1.24)
GFR, Estimated: 47 mL/min — ABNORMAL LOW (ref >60)
Glucose, Bld: 268 mg/dL — ABNORMAL HIGH (ref 70–99)
Potassium: 3.6 mmol/L (ref 3.5–5.1)
Sodium: 136 mmol/L (ref 135–145)
Total Bilirubin: 1 mg/dL (ref 0.3–1.2)
Total Protein: 6.9 g/dL (ref 6.5–8.1)

## 2021-01-04 LAB — CBC
HCT: 45.7 % (ref 39.0–52.0)
HCT: 33 % — ABNORMAL LOW (ref 39.0–52.0)
Hemoglobin: 14.5 g/dL (ref 13.0–17.0)
Hemoglobin: 11.3 g/dL — ABNORMAL LOW (ref 13.0–17.0)
MCH: 29.6 pg (ref 26.0–34.0)
MCH: 30 pg (ref 26.0–34.0)
MCHC: 31.7 g/dL (ref 30.0–36.0)
MCHC: 34.2 g/dL (ref 30.0–36.0)
MCV: 93.3 fL (ref 80.0–100.0)
MCV: 87.5 fL (ref 80.0–100.0)
Platelets: 262 10*3/uL (ref 150–400)
Platelets: 168 10*3/uL (ref 150–400)
RBC: 4.9 MIL/uL (ref 4.22–5.81)
RBC: 3.77 MIL/uL — ABNORMAL LOW (ref 4.22–5.81)
RDW: 12.8 % (ref 11.5–15.5)
RDW: 12.8 % (ref 11.5–15.5)
WBC: 24.6 10*3/uL — ABNORMAL HIGH (ref 4.0–10.5)
WBC: 12.7 10*3/uL — ABNORMAL HIGH (ref 4.0–10.5)
nRBC: 0 % (ref 0.0–0.2)
nRBC: 0 % (ref 0.0–0.2)

## 2021-01-04 LAB — ETHANOL: Alcohol, Ethyl (B): <10 mg/dL (ref <10)

## 2021-01-04 LAB — RESP PANEL BY RT-PCR (FLU A&B, COVID) ARPGX2
Influenza A by PCR: NEGATIVE
Influenza B by PCR: NEGATIVE
SARS Coronavirus 2 by RT PCR: NEGATIVE

## 2021-01-04 LAB — RAPID URINE DRUG SCREEN, HOSP PERFORMED
Amphetamines: NOT DETECTED
Amphetamines: NOT DETECTED
Barbiturates: NOT DETECTED
Barbiturates: NOT DETECTED
Benzodiazepines: NOT DETECTED
Benzodiazepines: NOT DETECTED
Cocaine: NOT DETECTED
Cocaine: NOT DETECTED
Opiates: NOT DETECTED
Opiates: NOT DETECTED
Tetrahydrocannabinol: NOT DETECTED
Tetrahydrocannabinol: NOT DETECTED

## 2021-01-04 LAB — HIV ANTIBODY (ROUTINE TESTING W REFLEX): HIV Screen 4th Generation wRfx: NONREACTIVE

## 2021-01-04 LAB — I-STAT ARTERIAL BLOOD GAS, ED
Acid-base deficit: 1 mmol/L (ref 0.0–2.0)
Bicarbonate: 24.1 mmol/L (ref 20.0–28.0)
Calcium, Ion: 1.09 mmol/L — ABNORMAL LOW (ref 1.15–1.40)
HCT: 31 % — ABNORMAL LOW (ref 39.0–52.0)
Hemoglobin: 10.5 g/dL — ABNORMAL LOW (ref 13.0–17.0)
O2 Saturation: 100 %
Patient temperature: 97.5
Potassium: 3.5 mmol/L (ref 3.5–5.1)
Sodium: 135 mmol/L (ref 135–145)
TCO2: 25 mmol/L (ref 22–32)
pCO2 arterial: 39.8 mmHg (ref 32.0–48.0)
pH, Arterial: 7.389 (ref 7.350–7.450)
pO2, Arterial: 342 mmHg — ABNORMAL HIGH (ref 83.0–108.0)

## 2021-01-04 LAB — URINALYSIS, ROUTINE W REFLEX MICROSCOPIC
Bacteria, UA: NONE SEEN
Bilirubin Urine: NEGATIVE
Glucose, UA: >=500 mg/dL — AB
Hgb urine dipstick: NEGATIVE
Ketones, ur: 20 mg/dL — AB
Leukocytes,Ua: NEGATIVE
Nitrite: NEGATIVE
Protein, ur: 30 mg/dL — AB
Specific Gravity, Urine: 1.013 (ref 1.005–1.030)
pH: 8 (ref 5.0–8.0)

## 2021-01-04 LAB — PROTIME-INR
INR: 1 (ref 0.8–1.2)
INR: 1.1 (ref 0.8–1.2)
Prothrombin Time: 12.8 seconds (ref 11.4–15.2)
Prothrombin Time: 14.1 seconds (ref 11.4–15.2)

## 2021-01-04 LAB — GLUCOSE, CAPILLARY
Glucose-Capillary: 193 mg/dL — ABNORMAL HIGH (ref 70–99)
Glucose-Capillary: 236 mg/dL — ABNORMAL HIGH (ref 70–99)

## 2021-01-04 LAB — I-STAT CHEM 8, ED
BUN: 18 mg/dL (ref 8–23)
BUN: 18 mg/dL (ref 8–23)
Calcium, Ion: 1.22 mmol/L (ref 1.15–1.40)
Calcium, Ion: 0.96 mmol/L — ABNORMAL LOW (ref 1.15–1.40)
Chloride: 101 mmol/L (ref 98–111)
Chloride: 103 mmol/L (ref 98–111)
Creatinine, Ser: 1.2 mg/dL (ref 0.61–1.24)
Creatinine, Ser: 1.4 mg/dL — ABNORMAL HIGH (ref 0.61–1.24)
Glucose, Bld: 141 mg/dL — ABNORMAL HIGH (ref 70–99)
Glucose, Bld: 256 mg/dL — ABNORMAL HIGH (ref 70–99)
HCT: 38 % — ABNORMAL LOW (ref 39.0–52.0)
HCT: 41 % (ref 39.0–52.0)
Hemoglobin: 12.9 g/dL — ABNORMAL LOW (ref 13.0–17.0)
Hemoglobin: 13.9 g/dL (ref 13.0–17.0)
Potassium: 4.9 mmol/L (ref 3.5–5.1)
Potassium: 3.5 mmol/L (ref 3.5–5.1)
Sodium: 138 mmol/L (ref 135–145)
Sodium: 135 mmol/L (ref 135–145)
TCO2: 30 mmol/L (ref 22–32)
TCO2: 21 mmol/L — ABNORMAL LOW (ref 22–32)

## 2021-01-04 LAB — APTT: aPTT: 21 seconds — ABNORMAL LOW (ref 24–36)

## 2021-01-04 LAB — MAGNESIUM: Magnesium: 1.6 mg/dL — ABNORMAL LOW (ref 1.7–2.4)

## 2021-01-04 LAB — LACTIC ACID, PLASMA: Lactic Acid, Venous: 4.9 mmol/L (ref 0.5–1.9)

## 2021-01-04 LAB — SALICYLATE LEVEL: Salicylate Lvl: <7 mg/dL — ABNORMAL LOW (ref 7.0–30.0)

## 2021-01-04 LAB — ACETAMINOPHEN LEVEL: Acetaminophen (Tylenol), Serum: <10 ug/mL — ABNORMAL LOW (ref 10–30)

## 2021-01-04 MED ORDER — ACETAMINOPHEN 160 MG/5ML PO SOLN
650.0000 mg | ORAL | Status: DC | PRN
  Administered 2021-01-05 – 2021-01-16 (×21): 650 mg
  Filled 2021-01-04 (×21): qty 20.3

## 2021-01-04 MED ORDER — CHLORHEXIDINE GLUCONATE 0.12% ORAL RINSE (MEDLINE KIT)
15.0000 mL | Freq: Two times a day (BID) | OROMUCOSAL | Status: DC
  Administered 2021-01-04 – 2021-01-16 (×23): 15 mL via OROMUCOSAL

## 2021-01-04 MED ORDER — DOCUSATE SODIUM 100 MG PO CAPS: 100.0000 mg | ORAL_CAPSULE | Freq: Two times a day (BID) | ORAL | Status: DC

## 2021-01-04 MED ORDER — NOREPINEPHRINE 4 MG/250ML-% IV SOLN: 2.0000 ug/min | INTRAVENOUS | Status: DC

## 2021-01-04 MED ORDER — FUROSEMIDE 10 MG/ML IJ SOLN: 40.0000 mg | Freq: Once | INTRAMUSCULAR | Status: DC

## 2021-01-04 MED ORDER — SODIUM CHLORIDE 0.9 % IV SOLN
3.0000 g | Freq: Three times a day (TID) | INTRAVENOUS | Status: DC
  Administered 2021-01-05 – 2021-01-08 (×11): 3 g via INTRAVENOUS
  Filled 2021-01-04: qty 3
  Filled 2021-01-04: qty 8
  Filled 2021-01-04 (×2): qty 3
  Filled 2021-01-04: qty 8
  Filled 2021-01-04: qty 3
  Filled 2021-01-04: qty 8
  Filled 2021-01-04: qty 3
  Filled 2021-01-04 (×2): qty 8
  Filled 2021-01-04: qty 3
  Filled 2021-01-04 (×5): qty 8

## 2021-01-04 MED ORDER — LEVETIRACETAM IN NACL 500 MG/100ML IV SOLN
500.0000 mg | Freq: Two times a day (BID) | INTRAVENOUS | Status: DC
  Administered 2021-01-05: 500 mg via INTRAVENOUS
  Filled 2021-01-04 (×3): qty 100

## 2021-01-04 MED ORDER — STROKE: EARLY STAGES OF RECOVERY BOOK
Freq: Once | Status: DC
  Filled 2021-01-04: qty 1

## 2021-01-04 MED ORDER — SODIUM CHLORIDE 0.9 % IV SOLN
250.0000 mL | INTRAVENOUS | Status: DC
  Administered 2021-01-05: 250 mL via INTRAVENOUS

## 2021-01-04 MED ORDER — KETOROLAC TROMETHAMINE 30 MG/ML IJ SOLN
30.0000 mg | Freq: Once | INTRAMUSCULAR | Status: AC
  Administered 2021-01-04: 30 mg via INTRAVENOUS
  Filled 2021-01-04: qty 1

## 2021-01-04 MED ORDER — MIDAZOLAM HCL 2 MG/2ML IJ SOLN: 2.0000 mg | INTRAMUSCULAR | Status: DC | PRN

## 2021-01-04 MED ORDER — FENTANYL CITRATE (PF) 100 MCG/2ML IJ SOLN
50.0000 ug | INTRAMUSCULAR | Status: DC | PRN
Start: 2021-01-04 — End: 2021-01-05

## 2021-01-04 MED ORDER — ACETAMINOPHEN 650 MG RE SUPP: 650.0000 mg | RECTAL | Status: DC | PRN

## 2021-01-04 MED ORDER — POLYETHYLENE GLYCOL 3350 17 G PO PACK
17.0000 g | PACK | Freq: Every day | ORAL | Status: DC
  Administered 2021-01-05 – 2021-01-10 (×6): 17 g
  Filled 2021-01-04 (×6): qty 1

## 2021-01-04 MED ORDER — NIMODIPINE 30 MG PO CAPS
60.0000 mg | ORAL_CAPSULE | ORAL | Status: DC
  Filled 2021-01-04 (×5): qty 2

## 2021-01-04 MED ORDER — PANTOPRAZOLE SODIUM 40 MG PO PACK
40.0000 mg | PACK | Freq: Every day | ORAL | Status: DC
  Administered 2021-01-05 – 2021-01-16 (×12): 40 mg
  Filled 2021-01-04 (×13): qty 20

## 2021-01-04 MED ORDER — METOCLOPRAMIDE HCL 5 MG/ML IJ SOLN
10.0000 mg | Freq: Once | INTRAMUSCULAR | Status: AC
  Administered 2021-01-04: 10 mg via INTRAVENOUS
  Filled 2021-01-04: qty 2

## 2021-01-04 MED ORDER — POTASSIUM CHLORIDE 10 MEQ/100ML IV SOLN
10.0000 meq | INTRAVENOUS | Status: AC
Start: 2021-01-04 — End: 2021-01-04

## 2021-01-04 MED ORDER — ACETAMINOPHEN 325 MG PO TABS: 650.0000 mg | ORAL_TABLET | ORAL | Status: DC | PRN

## 2021-01-04 MED ORDER — AMIODARONE LOAD VIA INFUSION
150.0000 mg | Freq: Once | INTRAVENOUS | Status: DC | PRN
  Filled 2021-01-04: qty 83.34

## 2021-01-04 MED ORDER — PROPOFOL 1000 MG/100ML IV EMUL: 0.0000 ug/kg/min | INTRAVENOUS | Status: DC

## 2021-01-04 MED ORDER — SODIUM CHLORIDE 0.9 % IV BOLUS
1000.0000 mL | Freq: Once | INTRAVENOUS | Status: AC
  Administered 2021-01-04: 1000 mL via INTRAVENOUS

## 2021-01-04 MED ORDER — ONDANSETRON HCL 4 MG/2ML IJ SOLN
4.0000 mg | Freq: Four times a day (QID) | INTRAMUSCULAR | Status: DC | PRN
  Administered 2021-01-05 – 2021-01-11 (×2): 4 mg via INTRAVENOUS
  Filled 2021-01-04 (×2): qty 2

## 2021-01-04 MED ORDER — AMIODARONE HCL IN DEXTROSE 360-4.14 MG/200ML-% IV SOLN: 30.0000 mg/h | INTRAVENOUS | Status: DC

## 2021-01-04 MED ORDER — ONDANSETRON 4 MG PO TBDP
4.0000 mg | ORAL_TABLET | Freq: Four times a day (QID) | ORAL | Status: DC | PRN
Start: 2021-01-04 — End: 2021-01-16

## 2021-01-04 MED ORDER — INSULIN ASPART 100 UNIT/ML IJ SOLN
0.0000 [IU] | INTRAMUSCULAR | Status: DC
  Administered 2021-01-04 – 2021-01-05 (×2): 5 [IU] via SUBCUTANEOUS
  Administered 2021-01-05 (×4): 3 [IU] via SUBCUTANEOUS
  Administered 2021-01-05: 5 [IU] via SUBCUTANEOUS
  Administered 2021-01-06 (×2): 3 [IU] via SUBCUTANEOUS
  Administered 2021-01-06: 2 [IU] via SUBCUTANEOUS
  Administered 2021-01-06 (×2): 3 [IU] via SUBCUTANEOUS
  Administered 2021-01-06: 2 [IU] via SUBCUTANEOUS
  Administered 2021-01-06: 3 [IU] via SUBCUTANEOUS
  Administered 2021-01-07 (×2): 5 [IU] via SUBCUTANEOUS
  Administered 2021-01-07: 2 [IU] via SUBCUTANEOUS
  Administered 2021-01-07: 5 [IU] via SUBCUTANEOUS
  Administered 2021-01-07: 3 [IU] via SUBCUTANEOUS
  Administered 2021-01-08 (×3): 5 [IU] via SUBCUTANEOUS
  Administered 2021-01-08: 3 [IU] via SUBCUTANEOUS
  Administered 2021-01-08: 8 [IU] via SUBCUTANEOUS
  Administered 2021-01-08: 5 [IU] via SUBCUTANEOUS
  Administered 2021-01-08: 3 [IU] via SUBCUTANEOUS
  Administered 2021-01-09 (×2): 5 [IU] via SUBCUTANEOUS
  Administered 2021-01-09: 11 [IU] via SUBCUTANEOUS
  Administered 2021-01-09: 3 [IU] via SUBCUTANEOUS
  Administered 2021-01-09: 5 [IU] via SUBCUTANEOUS
  Administered 2021-01-09: 8 [IU] via SUBCUTANEOUS
  Administered 2021-01-10: 2 [IU] via SUBCUTANEOUS
  Administered 2021-01-10: 3 [IU] via SUBCUTANEOUS
  Administered 2021-01-10: 5 [IU] via SUBCUTANEOUS
  Administered 2021-01-10 (×2): 3 [IU] via SUBCUTANEOUS
  Administered 2021-01-11 (×3): 2 [IU] via SUBCUTANEOUS
  Administered 2021-01-11 – 2021-01-12 (×3): 3 [IU] via SUBCUTANEOUS
  Administered 2021-01-12: 2 [IU] via SUBCUTANEOUS
  Administered 2021-01-12: 3 [IU] via SUBCUTANEOUS
  Administered 2021-01-12: 2 [IU] via SUBCUTANEOUS
  Administered 2021-01-13 (×3): 3 [IU] via SUBCUTANEOUS
  Administered 2021-01-14: 5 [IU] via SUBCUTANEOUS
  Administered 2021-01-14 (×2): 2 [IU] via SUBCUTANEOUS
  Administered 2021-01-14 – 2021-01-15 (×4): 3 [IU] via SUBCUTANEOUS
  Administered 2021-01-15 (×2): 2 [IU] via SUBCUTANEOUS
  Administered 2021-01-15 – 2021-01-16 (×4): 3 [IU] via SUBCUTANEOUS

## 2021-01-04 MED ORDER — NIMODIPINE 6 MG/ML PO SOLN
60.0000 mg | ORAL | Status: DC
  Administered 2021-01-04 – 2021-01-05 (×4): 60 mg
  Filled 2021-01-04 (×5): qty 10

## 2021-01-04 MED ORDER — DOCUSATE SODIUM 50 MG/5ML PO LIQD
100.0000 mg | Freq: Two times a day (BID) | ORAL | Status: DC
  Administered 2021-01-05 – 2021-01-10 (×12): 100 mg
  Filled 2021-01-04 (×13): qty 10

## 2021-01-04 MED ORDER — IOHEXOL 350 MG/ML SOLN
100.0000 mL | Freq: Once | INTRAVENOUS | Status: AC | PRN
  Administered 2021-01-04: 100 mL via INTRAVENOUS

## 2021-01-04 MED ORDER — PROPOFOL 1000 MG/100ML IV EMUL: 5.0000 ug/kg/min | INTRAVENOUS | Status: DC

## 2021-01-04 MED ORDER — NOREPINEPHRINE 4 MG/250ML-% IV SOLN
0.0000 ug/min | INTRAVENOUS | Status: DC
  Administered 2021-01-04: 12 ug/min via INTRAVENOUS

## 2021-01-04 MED ORDER — AMIODARONE HCL IN DEXTROSE 360-4.14 MG/200ML-% IV SOLN: 60.0000 mg/h | INTRAVENOUS | Status: DC

## 2021-01-04 MED ORDER — ETOMIDATE 2 MG/ML IV SOLN
INTRAVENOUS | Status: AC | PRN
  Administered 2021-01-04: 20 mg via INTRAVENOUS

## 2021-01-04 MED ORDER — ROCURONIUM BROMIDE 50 MG/5ML IV SOLN
INTRAVENOUS | Status: AC | PRN
  Administered 2021-01-04: 80 mg via INTRAVENOUS

## 2021-01-04 MED ORDER — SODIUM CHLORIDE 0.9 % IV SOLN: INTRAVENOUS | Status: DC

## 2021-01-04 MED ORDER — PANTOPRAZOLE SODIUM 40 MG PO TBEC: 40.0000 mg | DELAYED_RELEASE_TABLET | Freq: Every day | ORAL | Status: DC

## 2021-01-04 MED ORDER — NOREPINEPHRINE 4 MG/250ML-% IV SOLN
INTRAVENOUS | Status: AC
  Administered 2021-01-04: 4 mg
  Filled 2021-01-04: qty 250

## 2021-01-04 MED ORDER — DIPHENHYDRAMINE HCL 50 MG/ML IJ SOLN
25.0000 mg | Freq: Once | INTRAMUSCULAR | Status: AC
  Administered 2021-01-04: 25 mg via INTRAVENOUS
  Filled 2021-01-04: qty 1

## 2021-01-04 MED ORDER — CLEVIDIPINE BUTYRATE 0.5 MG/ML IV EMUL
0.0000 mg/h | INTRAVENOUS | Status: DC
  Administered 2021-01-04: 2 mg/h via INTRAVENOUS
  Administered 2021-01-05: 1 mg/h via INTRAVENOUS
  Administered 2021-01-05 (×2): 8 mg/h via INTRAVENOUS
  Filled 2021-01-04: qty 100
  Filled 2021-01-04: qty 50

## 2021-01-04 MED ORDER — AMIODARONE LOAD VIA INFUSION
150.0000 mg | Freq: Once | INTRAVENOUS | Status: DC
  Filled 2021-01-04: qty 83.34

## 2021-01-04 MED ORDER — ORAL CARE MOUTH RINSE
15.0000 mL | OROMUCOSAL | Status: DC
  Administered 2021-01-05 – 2021-01-16 (×113): 15 mL via OROMUCOSAL

## 2021-01-04 NOTE — ED Notes (Signed)
Labetalol 10 mg given IVP per Dr. Nicanor Alcon verbal order

## 2021-01-04 NOTE — Procedures (Signed)
PREOP DX: Hydrocephalus  POSTOP DX: Same  PROCEDURE: right frontal ventriculostomy   SURGEON: Dr. [name], MD  ANESTHESIA: Lidocaine with epinephrine Local  EBL: Minimal  SPECIMENS: None  COMPLICATIONS: None  CONDITION: Hemodynamically stable  INDICATIONS: Shawn Lane is a 60 y.o. male admitted with subarachnoid hemorrhage and associated hydrocephalus.  Ventricular drain was therefore indicated.  Patient's family was unavailable at the time and required emergent placement of the drain.  We therefore proceeded with implied consent.  PROCEDURE IN DETAIL: Skin of the right frontal scalp was clipped, prepped and draped in the usual sterile fashion.  Scalp was then infiltrated with local anesthetic with epinephrine.  Skin incision was made sharply, and twist drill burr hole was made.  The dura was then incised, and the ventricular catheter was passed in a single attempt into the ventricle.  Good CSF flow was obtained.  The catheter was then tunneled subcutaneously and connected to a drainage system and the skin incision closed.  The drain was then secured in place.  FINDINGS: 1. Opening pressure <20cmh2o 2.  Blood tinged CSF   [name], MD [facility]

## 2021-01-04 NOTE — Progress Notes (Signed)
   01/14/2021 1900  Clinical Encounter Type  Visited With Family  Visit Type Trauma  Referral From Nurse  Consult/Referral To Chaplain  Spiritual Encounters  Spiritual Needs Emotional  The chaplain responded to page to accompany the doctor to talk to the patient's wife and escort him to [address] [place]. The patient was having a procedure done at bedside upon arrival. The chaplain spoke with doctors and escorted the wife to speak to them. The chaplain sat with the patient's son while the wife spoke to the doctors. The chaplain provided words of comfort and sat with the wife and son to provide ministry of presence. The chaplain will follow up as needed.

## 2021-01-04 NOTE — ED Notes (Signed)
Neurosurgery paged regarding pt BP and uneven pupils

## 2021-01-04 NOTE — Progress Notes (Signed)
  NEUROSURGERY PROGRESS NOTE   Received call from nursing regarding new mixed extensor/flexor posturing. Patient otherwise hemodynamically stable. STAT head CT order.   I came by to evaluate the patient. Exam relatively unchanged with non-reactive pupils. Now posturing to pain. CT head reviewed and compared to prior. There is worsening ventriculomegaly, although EVD catheter tip is in great position. EVD is pulsatile with bloody CSF in tubing. Appears under drained. EVD drain lowered from 15mg Hg to . Will continue to monitor.  , PA-C [facility] [place]

## 2021-01-04 NOTE — Progress Notes (Signed)
Patient transported to CT and back without complications.  

## 2021-01-04 NOTE — Procedures (Signed)
Central Venous Catheter Insertion Procedure Note  Shawn Lane  [id number]  [birth year]  Date:01/13/2021  Time:6:34 PM   Provider Performing:Shawn Lane   Procedure: Insertion of Non-tunneled Central Venous Catheter(36556) without US guidance  Indication(s) Medication administration and Difficult access  Consent Unable to obtain consent due to emergent nature of procedure.  Anesthesia Topical only with 1% lidocaine   Timeout Verified patient identification, verified procedure, site/side was marked, verified correct patient position, special equipment/implants available, medications/allergies/relevant history reviewed, required imaging and test results available.  Sterile Technique Maximal sterile technique including full sterile barrier drape, hand hygiene, sterile gown, sterile gloves, mask, hair covering, sterile ultrasound probe cover (if used).  Procedure Description Area of catheter insertion was cleaned with chlorhexidine and draped in sterile fashion.  With real-time ultrasound guidance a central venous catheter was placed into the right subclavian vein. Nonpulsatile blood flow and easy flushing noted in all ports.  The catheter was sutured in place and sterile dressing applied.  Complications/Tolerance None; patient tolerated the procedure well. Chest X-ray is ordered to verify placement for internal jugular or subclavian cannulation.   Chest x-ray is not ordered for femoral cannulation.  EBL Minimal  Specimen(s) None  Simonne Martinet ACNP-BC [facility] Pulmonary/Critical Care Pager # [phone] OR # [phone] if no answer
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
## 2021-01-04 NOTE — Progress Notes (Signed)
eLink Physician-Brief Progress Note Patient Name: Shawn Lane DOB: [birth year] MRN: [id number]   Date of Service  01/14/2021  HPI/Events of Note  Patient reportedly posturing according to bedside RN, when I camera'd in I did not observe the posturing, his Troponin is elevated from baseline of 9 to 251, he did have wide complex tachycardia earlier, treated with a cardiac thump followed by Amiodarone, no echocardiogram in the chart, elevated Troponin likely secondary to type 2 cardiac ischemia.  eICU Interventions  Will order a 12 lead EKG to r/o STEMI, trend Troponin, and order an a.m echocardiogram, anti-coagulation is contraindicated due to [facility], will ask bedside RN to notify Neurology / neurosurgery of changes to the neuro exam.        Thomasene Lot Sereen Schaff 12/23/2020, 9:07 PM

## 2021-01-04 NOTE — ED Notes (Signed)
Pt transported to CT ?

## 2021-01-04 NOTE — Progress Notes (Signed)
   01/10/2021 2000  Provider Notification  Provider Name/Title Elink  Date Provider Notified 01/02/2021  Time Provider Notified 2000  Notification Type Call  Notification Reason Change in status;Critical result (Patient is flexing and extending RUE intermittently and extenting Lower extremeties bilaterally. Gag reflex pleasant and but corneals still impaired)  Test performed and critical result Troponin 251  Date Critical Result Received 01/10/2021  Time Critical Result Received 1945  Provider response Other (Comment) (awaiting call back from PCCM Organ)

## 2021-01-04 NOTE — ED Triage Notes (Incomplete)
Pt bib ems from home with reports of worsening headache, seen today for the same. Pt diaphoretic on scene. En route pt complained of worsening head/neck pain and became increasingly hypertensive. Pt then went unresponsive and apneic. Pt arrives with assisted ventilations. Pupils initially a 1 and now a 4.

## 2021-01-04 NOTE — ED Notes (Signed)
300mg  amiodarone given per Dr 

## 2021-01-04 NOTE — Progress Notes (Signed)
Patient transported to 4N25 from ED. RN at bedside.

## 2021-01-04 NOTE — ED Notes (Signed)
Pt went into VT with pulse. Pt BP up above 200 systolic. EDP at the bedside.

## 2021-01-04 NOTE — Progress Notes (Signed)
Pharmacy Antibiotic Note  Shawn Lane is a 60 y.o. male admitted on 01/02/2021 presenting as unresponsive with previous head trauma, intubated in ED and concern for aspiration.  Pharmacy has been consulted for Unasyn dosing.  Plan: Unasyn 3g IV every 8 hours Monitor renal function, clinical progression and LOT     Temp (24hrs), Avg:98.1 F (36.7 C), Min:97.5 F (36.4 C), Max:98.5 F (36.9 C)  Recent Labs  Lab 01/08/2021 0340 12/19/2020 1605 12/24/2020 1606  WBC  --   --  24.6*  CREATININE 1.20 1.40*  --   LATICACIDVEN  --   --  4.9*    Estimated Creatinine Clearance: 59.2 mL/min (A) (by C-G formula based on SCr of 1.4 mg/dL (H)).    No Known Allergies  [name], PharmD Clinical Pharmacist ED Pharmacist Phone # [phone] 12/24/2020 4:54 PM

## 2021-01-04 NOTE — Progress Notes (Signed)
Patient transported to CT scan  And back to 4N25 without incidence.

## 2021-01-04 NOTE — Discharge Instructions (Signed)
Keep yourself hydrated.  Follow-up with your doctor.  Return to the ED with worsening symptoms including worsening headache, difficulty speaking, unilateral weakness, numbness, tingling, any other concerns.
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
## 2021-01-04 NOTE — Progress Notes (Signed)
Tom with [facility] called for pt assessment. Will continue to monitor pt once a shift. Referral number is  [id number]
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
## 2021-01-05 ENCOUNTER — Inpatient Hospital Stay (HOSPITAL_COMMUNITY): Payer: 59

## 2021-01-05 DIAGNOSIS — I445 Left posterior fascicular block: Secondary | ICD-10-CM

## 2021-01-05 DIAGNOSIS — I4519 Other right bundle-branch block: Secondary | ICD-10-CM | POA: Diagnosis not present

## 2021-01-05 DIAGNOSIS — J9601 Acute respiratory failure with hypoxia: Secondary | ICD-10-CM | POA: Diagnosis not present

## 2021-01-05 DIAGNOSIS — J690 Pneumonitis due to inhalation of food and vomit: Secondary | ICD-10-CM | POA: Diagnosis not present

## 2021-01-05 DIAGNOSIS — I609 Nontraumatic subarachnoid hemorrhage, unspecified: Secondary | ICD-10-CM | POA: Diagnosis not present

## 2021-01-05 LAB — GLUCOSE, CAPILLARY
Glucose-Capillary: 173 mg/dL — ABNORMAL HIGH (ref 70–99)
Glucose-Capillary: 173 mg/dL — ABNORMAL HIGH (ref 70–99)
Glucose-Capillary: 176 mg/dL — ABNORMAL HIGH (ref 70–99)
Glucose-Capillary: 180 mg/dL — ABNORMAL HIGH (ref 70–99)
Glucose-Capillary: 204 mg/dL — ABNORMAL HIGH (ref 70–99)
Glucose-Capillary: 209 mg/dL — ABNORMAL HIGH (ref 70–99)

## 2021-01-05 LAB — CBC
HCT: 35.9 % — ABNORMAL LOW (ref 39.0–52.0)
Hemoglobin: 12.6 g/dL — ABNORMAL LOW (ref 13.0–17.0)
MCH: 29.4 pg (ref 26.0–34.0)
MCHC: 35.1 g/dL (ref 30.0–36.0)
MCV: 83.9 fL (ref 80.0–100.0)
Platelets: 197 10*3/uL (ref 150–400)
RBC: 4.28 MIL/uL (ref 4.22–5.81)
RDW: 12.9 % (ref 11.5–15.5)
WBC: 13.3 10*3/uL — ABNORMAL HIGH (ref 4.0–10.5)
nRBC: 0 % (ref 0.0–0.2)

## 2021-01-05 LAB — BASIC METABOLIC PANEL
Anion gap: 11 (ref 5–15)
BUN: 14 mg/dL (ref 8–23)
CO2: 21 mmol/L — ABNORMAL LOW (ref 22–32)
Calcium: 8.4 mg/dL — ABNORMAL LOW (ref 8.9–10.3)
Chloride: 100 mmol/L (ref 98–111)
Creatinine, Ser: 1.02 mg/dL (ref 0.61–1.24)
GFR, Estimated: >60 mL/min (ref >60)
Glucose, Bld: 168 mg/dL — ABNORMAL HIGH (ref 70–99)
Potassium: 3.3 mmol/L — ABNORMAL LOW (ref 3.5–5.1)
Sodium: 132 mmol/L — ABNORMAL LOW (ref 135–145)

## 2021-01-05 LAB — POCT I-STAT 7, (LYTES, BLD GAS, ICA,H+H)
Acid-Base Excess: 0 mmol/L (ref 0.0–2.0)
Bicarbonate: 21.6 mmol/L (ref 20.0–28.0)
Calcium, Ion: 1.11 mmol/L — ABNORMAL LOW (ref 1.15–1.40)
HCT: 34 % — ABNORMAL LOW (ref 39.0–52.0)
Hemoglobin: 11.6 g/dL — ABNORMAL LOW (ref 13.0–17.0)
O2 Saturation: 97 %
Patient temperature: 98
Potassium: 3.3 mmol/L — ABNORMAL LOW (ref 3.5–5.1)
Sodium: 135 mmol/L (ref 135–145)
TCO2: 22 mmol/L (ref 22–32)
pCO2 arterial: 24.5 mmHg — ABNORMAL LOW (ref 32.0–48.0)
pH, Arterial: 7.554 — ABNORMAL HIGH (ref 7.350–7.450)
pO2, Arterial: 76 mmHg — ABNORMAL LOW (ref 83.0–108.0)

## 2021-01-05 LAB — TROPONIN I (HIGH SENSITIVITY): Troponin I (High Sensitivity): 825 ng/L (ref <18)

## 2021-01-05 LAB — TRIGLYCERIDES: Triglycerides: 183 mg/dL — ABNORMAL HIGH (ref <150)

## 2021-01-05 LAB — ECHOCARDIOGRAM COMPLETE
AR max vel: 3.18 cm2
AV Area VTI: 2.78 cm2
AV Area mean vel: 2.8 cm2
AV Mean grad: 6 mmHg
AV Peak grad: 12.1 mmHg
Ao pk vel: 1.74 m/s
Calc EF: 66.3 %
MV M vel: 5.06 m/s
MV Peak grad: 102.4 mmHg
MV VTI: 2.58 cm2
P 1/2 time: 409 msec
Radius: 0.4 cm
S' Lateral: 3.6 cm
Single Plane A2C EF: 66.9 %
Single Plane A4C EF: 69.4 %

## 2021-01-05 LAB — MAGNESIUM: Magnesium: 1.7 mg/dL (ref 1.7–2.4)

## 2021-01-05 LAB — LACTIC ACID, PLASMA: Lactic Acid, Venous: 1.3 mmol/L (ref 0.5–1.9)

## 2021-01-05 LAB — MRSA PCR SCREENING: MRSA by PCR: NEGATIVE

## 2021-01-05 LAB — SAMPLE TO BLOOD BANK

## 2021-01-05 LAB — HEMOGLOBIN A1C
Hgb A1c MFr Bld: 6.5 % — ABNORMAL HIGH (ref 4.8–5.6)
Mean Plasma Glucose: 139.85 mg/dL

## 2021-01-05 LAB — PHOSPHORUS: Phosphorus: 2.7 mg/dL (ref 2.5–4.6)

## 2021-01-05 MED ORDER — CHLORHEXIDINE GLUCONATE CLOTH 2 % EX PADS
6.0000 | MEDICATED_PAD | Freq: Every day | CUTANEOUS | Status: DC
  Administered 2021-01-06 – 2021-01-15 (×10): 6 via TOPICAL

## 2021-01-05 MED ORDER — NIMODIPINE 30 MG PO CAPS: 60.0000 mg | ORAL_CAPSULE | ORAL | Status: DC

## 2021-01-05 MED ORDER — NIMODIPINE 6 MG/ML PO SOLN
60.0000 mg | ORAL | Status: DC
  Administered 2021-01-05 – 2021-01-16 (×66): 60 mg
  Filled 2021-01-05 (×65): qty 10

## 2021-01-05 MED ORDER — MAGNESIUM SULFATE 2 GM/50ML IV SOLN
2.0000 g | Freq: Once | INTRAVENOUS | Status: AC
  Administered 2021-01-05: 2 g via INTRAVENOUS
  Filled 2021-01-05: qty 50

## 2021-01-05 MED ORDER — CLEVIDIPINE BUTYRATE 0.5 MG/ML IV EMUL
0.0000 mg/h | INTRAVENOUS | Status: DC
  Administered 2021-01-05: 10 mg/h via INTRAVENOUS
  Administered 2021-01-06: 21 mg/h via INTRAVENOUS
  Administered 2021-01-06 (×2): 12 mg/h via INTRAVENOUS
  Administered 2021-01-06: 8 mg/h via INTRAVENOUS
  Administered 2021-01-06: 16 mg/h via INTRAVENOUS
  Administered 2021-01-07 (×2): 18 mg/h via INTRAVENOUS
  Administered 2021-01-07 (×4): 21 mg/h via INTRAVENOUS
  Administered 2021-01-07: 18 mg/h via INTRAVENOUS
  Administered 2021-01-07: 14 mg/h via INTRAVENOUS
  Administered 2021-01-08: 21 mg/h via INTRAVENOUS
  Administered 2021-01-08: 16 mg/h via INTRAVENOUS
  Administered 2021-01-08: 20 mg/h via INTRAVENOUS
  Administered 2021-01-08: 8 mg/h via INTRAVENOUS
  Administered 2021-01-09 (×3): 18 mg/h via INTRAVENOUS
  Administered 2021-01-09: 2 mg/h via INTRAVENOUS
  Administered 2021-01-09: 19 mg/h via INTRAVENOUS
  Administered 2021-01-09: 20 mg/h via INTRAVENOUS
  Administered 2021-01-10: 10 mg/h via INTRAVENOUS
  Administered 2021-01-10: 16 mg/h via INTRAVENOUS
  Administered 2021-01-10: 7 mg/h via INTRAVENOUS
  Filled 2021-01-05 (×29): qty 100

## 2021-01-05 MED ORDER — POTASSIUM CHLORIDE 10 MEQ/50ML IV SOLN
10.0000 meq | INTRAVENOUS | Status: AC
  Administered 2021-01-05 (×2): 10 meq via INTRAVENOUS
  Filled 2021-01-05 (×2): qty 50

## 2021-01-05 MED ORDER — FUROSEMIDE 10 MG/ML IJ SOLN: 40.0000 mg | Freq: Two times a day (BID) | INTRAMUSCULAR | Status: DC

## 2021-01-05 MED ORDER — LEVETIRACETAM 100 MG/ML PO SOLN
500.0000 mg | Freq: Two times a day (BID) | ORAL | Status: DC
  Administered 2021-01-05 – 2021-01-16 (×21): 500 mg
  Filled 2021-01-05 (×22): qty 5

## 2021-01-05 NOTE — TOC CAGE-AID Note (Signed)
Transition of Care [facility]) - CAGE-AID Screening   Patient Details  Name: Shawn Lane MRN: [id number] Date of Birth: [birth year]     Hewitt Shorts, RN Trauma Response Nurse Phone Number: [phone] 01/05/2021, 10:30 AM       CAGE-AID Screening: Substance Abuse Screening unable to be completed due to: : Patient unable to participate (pt is intubated)

## 2021-01-05 NOTE — Progress Notes (Signed)
EEG complete - results pending 

## 2021-01-05 NOTE — Progress Notes (Signed)
*  PRELIMINARY RESULTS* Echocardiogram 2D Echocardiogram has been performed.  Neomia Dear RDCS 01/05/2021, 8:08 AM

## 2021-01-05 NOTE — Progress Notes (Signed)
Transcranial Doppler  Date POD PCO2 HCT BP  MCA ACA PCA OPHT SIPH VERT Basilar  5/18 JH/RH     Right  Left   37  40   -40  -28   28  26   24  22   31   23-   -56  -33   -36  *         Right  Left                                            Right  Left                                             Right  Left                                             Right  Left                                            Right  Left                                            Right  Left                                        MCA = Middle Cerebral Artery      OPHT = Opthalmic Artery     BASILAR = Basilar Artery   ACA = Anterior Cerebral Artery     SIPH = Carotid Siphon PCA = Posterior Cerebral Artery   VERT = Verterbral Artery                   Normal MCA = 62+\-12 ACA = 50+\-12 PCA = 42+\-23  5/18 RT [place] 2.17  LT [place] 1.37 Results can be found under chart review under CV PROC. 01/05/2021 2:14 PM Vitor Overbaugh RVT, RDMS
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
## 2021-01-05 NOTE — Progress Notes (Signed)
eLink Physician-Brief Progress Note Patient Name: Shawn Lane DOB: 11-29-1960 MRN: [id number]   Date of Service  01/05/2021  HPI/Events of Note  K+ 3.6.  eICU Interventions  KCL 10 meq iv Q 1 hour x 2 doses ordered.        [name] U [name] 01/05/2021, 3:30 AM

## 2021-01-05 NOTE — Progress Notes (Addendum)
  NEUROSURGERY PROGRESS NOTE   Patient with new mixed posturing overnight. CT head revealed progressive hydro with EVD in good position. Lowered EVD last night due to progressive hydrocephalus on CT. No other acute events overnight  EXAM:  BP 120/62   Pulse 67   Temp 99.14 F (37.3 C)   Resp (!) 23   SpO2 100%   Intubated Breathing over vent Pupils 28mm, nonreactive + cough, gag, R corneal. Rhythmic movement R thumb and index finger No significant response to central pain EVD in place. Functional. 61cc drained in 12 hours.  IMPRESSION/PLAN 60 y.o. male SAH d#1 from ruptured right vertebral artery aneurysm. Some improvement in exam this am. Continues to have low output with EVD. Will lower to 0. Will order EEG due to rythmic movement R thumb and index finger. Continue supportive care.   I have seen and examined Shawn Lane and agree with the exam, impression, and plan as documented in the note by Cindra Presume, PA-C.  SAH d#1, H/H 5 mF4. Remains comatose with now some preserved brainstem reflexes but no meaningful responses to stimuli. Has has some fine twitching noted in BUE/BLE. Do not think that treatment of the aneurysm at this point is reasonable as his prognosis remains quite poor. Would continue supportive care. Will get EEG today.  [name], MD [facility]
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
## 2021-01-06 ENCOUNTER — Encounter (HOSPITAL_COMMUNITY): Admission: EM | Disposition: E | Payer: Self-pay | Source: Home / Self Care | Attending: Neurosurgery

## 2021-01-06 ENCOUNTER — Inpatient Hospital Stay (HOSPITAL_COMMUNITY): Payer: 59 | Admitting: Anesthesiology

## 2021-01-06 ENCOUNTER — Inpatient Hospital Stay (HOSPITAL_COMMUNITY): Payer: 59

## 2021-01-06 DIAGNOSIS — J9601 Acute respiratory failure with hypoxia: Secondary | ICD-10-CM | POA: Diagnosis not present

## 2021-01-06 DIAGNOSIS — I609 Nontraumatic subarachnoid hemorrhage, unspecified: Secondary | ICD-10-CM | POA: Diagnosis not present

## 2021-01-06 HISTORY — PX: IR ANGIO INTRA EXTRACRAN SEL INTERNAL CAROTID BILAT MOD SED: IMG5363

## 2021-01-06 HISTORY — PX: IR TRANSCATH/EMBOLIZ: IMG695

## 2021-01-06 HISTORY — PX: IR NEURO EACH ADD'L AFTER BASIC UNI RIGHT (MS): IMG5374

## 2021-01-06 HISTORY — PX: RADIOLOGY WITH ANESTHESIA: SHX6223

## 2021-01-06 HISTORY — PX: IR 3D INDEPENDENT WKST: IMG2385

## 2021-01-06 HISTORY — PX: IR ANGIOGRAM FOLLOW UP STUDY: IMG697

## 2021-01-06 HISTORY — PX: IR ANGIO VERTEBRAL SEL VERTEBRAL BILAT MOD SED: IMG5369

## 2021-01-06 LAB — POCT I-STAT 7, (LYTES, BLD GAS, ICA,H+H)
Acid-base deficit: 2 mmol/L (ref 0.0–2.0)
Bicarbonate: 22.3 mmol/L (ref 20.0–28.0)
Calcium, Ion: 1.15 mmol/L (ref 1.15–1.40)
HCT: 29 % — ABNORMAL LOW (ref 39.0–52.0)
Hemoglobin: 9.9 g/dL — ABNORMAL LOW (ref 13.0–17.0)
O2 Saturation: 99 %
Patient temperature: 99
Potassium: 3.4 mmol/L — ABNORMAL LOW (ref 3.5–5.1)
Sodium: 136 mmol/L (ref 135–145)
TCO2: 23 mmol/L (ref 22–32)
pCO2 arterial: 33.3 mmHg (ref 32.0–48.0)
pH, Arterial: 7.434 (ref 7.350–7.450)
pO2, Arterial: 116 mmHg — ABNORMAL HIGH (ref 83.0–108.0)

## 2021-01-06 LAB — CBC
HCT: 31.5 % — ABNORMAL LOW (ref 39.0–52.0)
Hemoglobin: 11.1 g/dL — ABNORMAL LOW (ref 13.0–17.0)
MCH: 30.2 pg (ref 26.0–34.0)
MCHC: 35.2 g/dL (ref 30.0–36.0)
MCV: 85.6 fL (ref 80.0–100.0)
Platelets: 179 10*3/uL (ref 150–400)
RBC: 3.68 MIL/uL — ABNORMAL LOW (ref 4.22–5.81)
RDW: 12.8 % (ref 11.5–15.5)
WBC: 11.8 10*3/uL — ABNORMAL HIGH (ref 4.0–10.5)
nRBC: 0 % (ref 0.0–0.2)

## 2021-01-06 LAB — BASIC METABOLIC PANEL
Anion gap: 8 (ref 5–15)
BUN: 19 mg/dL (ref 8–23)
CO2: 21 mmol/L — ABNORMAL LOW (ref 22–32)
Calcium: 7.9 mg/dL — ABNORMAL LOW (ref 8.9–10.3)
Chloride: 103 mmol/L (ref 98–111)
Creatinine, Ser: 1.16 mg/dL (ref 0.61–1.24)
GFR, Estimated: >60 mL/min (ref >60)
Glucose, Bld: 173 mg/dL — ABNORMAL HIGH (ref 70–99)
Potassium: 3.5 mmol/L (ref 3.5–5.1)
Sodium: 132 mmol/L — ABNORMAL LOW (ref 135–145)

## 2021-01-06 LAB — GLUCOSE, CAPILLARY
Glucose-Capillary: 186 mg/dL — ABNORMAL HIGH (ref 70–99)
Glucose-Capillary: 172 mg/dL — ABNORMAL HIGH (ref 70–99)
Glucose-Capillary: 169 mg/dL — ABNORMAL HIGH (ref 70–99)
Glucose-Capillary: 143 mg/dL — ABNORMAL HIGH (ref 70–99)
Glucose-Capillary: 145 mg/dL — ABNORMAL HIGH (ref 70–99)
Glucose-Capillary: 172 mg/dL — ABNORMAL HIGH (ref 70–99)

## 2021-01-06 LAB — MAGNESIUM
Magnesium: 2.1 mg/dL (ref 1.7–2.4)
Magnesium: 2.1 mg/dL (ref 1.7–2.4)

## 2021-01-06 LAB — PHOSPHORUS
Phosphorus: 2.8 mg/dL (ref 2.5–4.6)
Phosphorus: 2.7 mg/dL (ref 2.5–4.6)

## 2021-01-06 SURGERY — IR WITH ANESTHESIA
Anesthesia: General

## 2021-01-06 MED ORDER — IOHEXOL 300 MG/ML  SOLN
100.0000 mL | Freq: Once | INTRAMUSCULAR | Status: AC | PRN
  Administered 2021-01-06: 20 mL via INTRA_ARTERIAL

## 2021-01-06 MED ORDER — CLOPIDOGREL BISULFATE 75 MG PO TABS
300.0000 mg | ORAL_TABLET | Freq: Once | ORAL | Status: AC
  Administered 2021-01-06: 300 mg
  Filled 2021-01-06: qty 4

## 2021-01-06 MED ORDER — INSULIN ASPART 100 UNIT/ML IJ SOLN
3.0000 [IU] | INTRAMUSCULAR | Status: DC
  Administered 2021-01-06 – 2021-01-08 (×13): 3 [IU] via SUBCUTANEOUS

## 2021-01-06 MED ORDER — FENTANYL 2500MCG IN NS 250ML (10MCG/ML) PREMIX INFUSION
25.0000 ug/h | INTRAVENOUS | Status: DC
Start: 2021-01-06 — End: 2021-01-07
  Administered 2021-01-06: 25 ug/h via INTRAVENOUS
  Filled 2021-01-06: qty 250

## 2021-01-06 MED ORDER — CLOPIDOGREL BISULFATE 75 MG PO TABS: 300.0000 mg | ORAL_TABLET | Freq: Once | ORAL | Status: DC

## 2021-01-06 MED ORDER — CLEVIDIPINE BUTYRATE 0.5 MG/ML IV EMUL
INTRAVENOUS | Status: AC
  Filled 2021-01-06: qty 50

## 2021-01-06 MED ORDER — HEPARIN SODIUM (PORCINE) 1000 UNIT/ML IJ SOLN
INTRAMUSCULAR | Status: DC | PRN
  Administered 2021-01-06: 5000 [IU] via INTRAVENOUS

## 2021-01-06 MED ORDER — POTASSIUM CHLORIDE 20 MEQ PO PACK
40.0000 meq | PACK | ORAL | Status: AC
  Administered 2021-01-06 (×2): 40 meq
  Filled 2021-01-06 (×2): qty 2

## 2021-01-06 MED ORDER — CLEVIDIPINE BUTYRATE 0.5 MG/ML IV EMUL
INTRAVENOUS | Status: DC | PRN
  Administered 2021-01-06: 9 mg/h via INTRAVENOUS

## 2021-01-06 MED ORDER — ROCURONIUM BROMIDE 10 MG/ML (PF) SYRINGE
PREFILLED_SYRINGE | INTRAVENOUS | Status: DC | PRN
  Administered 2021-01-06: 50 mg via INTRAVENOUS
  Administered 2021-01-06: 20 mg via INTRAVENOUS

## 2021-01-06 MED ORDER — PROPOFOL 500 MG/50ML IV EMUL
INTRAVENOUS | Status: DC | PRN
  Administered 2021-01-06: 50 ug/kg/min via INTRAVENOUS

## 2021-01-06 MED ORDER — ASPIRIN 325 MG PO TABS: 650.0000 mg | ORAL_TABLET | Freq: Once | ORAL | Status: DC

## 2021-01-06 MED ORDER — VITAL HIGH PROTEIN PO LIQD
1000.0000 mL | ORAL | Status: DC
  Administered 2021-01-06: 1000 mL

## 2021-01-06 MED ORDER — IOHEXOL 300 MG/ML  SOLN
50.0000 mL | Freq: Once | INTRAMUSCULAR | Status: AC | PRN
  Administered 2021-01-06: 20 mL via INTRA_ARTERIAL

## 2021-01-06 MED ORDER — IOHEXOL 300 MG/ML  SOLN
100.0000 mL | Freq: Once | INTRAMUSCULAR | Status: AC | PRN
  Administered 2021-01-06: 60 mL via INTRA_ARTERIAL

## 2021-01-06 MED ORDER — LIDOCAINE HCL 1 % IJ SOLN
INTRAMUSCULAR | Status: AC
  Filled 2021-01-06: qty 20

## 2021-01-06 MED ORDER — ASPIRIN 325 MG PO TABS
650.0000 mg | ORAL_TABLET | Freq: Once | ORAL | Status: AC
  Administered 2021-01-06: 650 mg
  Filled 2021-01-06: qty 2

## 2021-01-06 MED ORDER — CLOPIDOGREL BISULFATE 75 MG PO TABS: 75.0000 mg | ORAL_TABLET | Freq: Every day | ORAL | Status: DC

## 2021-01-06 MED ORDER — ASPIRIN 325 MG PO TABS: 325.0000 mg | ORAL_TABLET | Freq: Every day | ORAL | Status: DC

## 2021-01-06 MED ORDER — VITAL 1.5 CAL PO LIQD
1000.0000 mL | ORAL | Status: DC
  Administered 2021-01-07 – 2021-01-13 (×7): 1000 mL
  Filled 2021-01-06 (×5): qty 1000

## 2021-01-06 MED ORDER — PROSOURCE TF PO LIQD
45.0000 mL | Freq: Two times a day (BID) | ORAL | Status: DC
  Administered 2021-01-06 – 2021-01-12 (×14): 45 mL
  Filled 2021-01-06 (×14): qty 45

## 2021-01-06 NOTE — Transfer of Care (Signed)
Immediate Anesthesia Transfer of Care Note  Patient: Shawn Lane  Procedure(s) Performed: IR WITH ANESTHESIA (N/A )  Patient Location: PACU  Anesthesia Type:General  Level of Consciousness: sedated, unresponsive and Patient remains intubated per anesthesia plan  Airway & Oxygen Therapy: Patient remains intubated per anesthesia plan and Patient placed on Ventilator (see vital sign flow sheet for setting)  Post-op Assessment: Report given to RN and Post -op Vital signs reviewed and stable  Post vital signs: Reviewed and stable  Last Vitals:  Vitals Value Taken Time  BP 103/56 01-23-21 1840  Temp    Pulse 67 01-23-2021 1846  Resp    SpO2 95 % 01-23-21 1846  Vitals shown include unvalidated device data.  Last Pain:  Vitals:   01/05/21 0400  TempSrc: Bladder         Complications: No complications documented.

## 2021-01-06 NOTE — Progress Notes (Signed)
RT NOTE: attempted SBT on patient this AM on CPAP/PSV of 12/5 however patient's respiratory rate was 7.  Placed patient back on full support settings and is currently tolerating well.  Will continue to monitor.

## 2021-01-06 NOTE — Anesthesia Procedure Notes (Signed)
Arterial Line Insertion Start/End5/19/2022 4:33 PM Performed by: Gwenyth Allegra, CRNA, CRNA  radial was placed Catheter size: 20 G Maximum sterile barriers used   Attempts: 1 Procedure performed without using ultrasound guided technique. Following insertion, Biopatch. Patient tolerated the procedure well with no immediate complications.
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
## 2021-01-06 NOTE — Progress Notes (Addendum)
eLink Physician-Brief Progress Note Patient Name: Shawn Lane DOB: [birth year] MRN: [id number]   Date of Service  01/08/2021  HPI/Events of Note  Agitation and ventilator asynchrony - Patient reaching for ETT. Nursing request for sedation and restraints.  eICU Interventions  Plan: 1. Bilateral soft wrist restraints X 12 hours. 2. Fentanyl IV infusion. Titrate to RASS = 0 to -1.     Intervention Category Major Interventions: Delirium, psychosis, severe agitation - evaluation and management  Shawn Lane 01/08/2021, 9:48 PM

## 2021-01-06 NOTE — Progress Notes (Addendum)
  NEUROSURGERY PROGRESS NOTE   No issues overnight.   EXAM:  BP 138/62   Pulse 77   Temp 98.78 F (37.1 C)   Resp 20   SpO2 100%   Intubated Pupils 48mm, reactive Opened eyes to voice, right nystagmus + corneal left Unusual mixed flexion/extension motor response to pain EVD in place, functional  IMPRESSION/PLAN 60 y.o. male SAH d#2 from ruptured right vertebral artery aneurysm. Appears to be making some improvement, now opening eyes to voice. May need to consider proceeding with treatment of aneurysm. EEG negative. - continue nimotop, keppra - continue EVD - continue supportive care   I have seen and examined Shawn Lane and agree with the exam, impression, and plan as documented in the note by Cindra Presume, PA-C.  60 y.o. man SAH d#2 with ruptured RVA aneurysm. Remains minimally conscious but has demonstrated clear improvement from yesterday, now opening eyes to voice, pupils reactive, breathing over vent. I therefore think consideration of aneurysm treatment is at least reasonable. Alternatively, it would also be reasonable to continue to monitor and provide supportive care as his exam, although improved, is still indicative of significant injury and prognosis remains relatively poor.  I have conveyed the difficulty in this situation to the patient's wife and sister at bedside. We reviewed the options above, complicated by the need for ASA/Plavix prior to aneurysm treatment with a Pipeline device. We did discuss the risks of the procedure including stroke, hemorrhage, arterial dissection, and groin hematoma. We also reviewed the risk of aneurysm re-rupture should we elect not to treat now. This is obviously a difficult situation without a clear course of action. All their questions this morning were answered. They would like a little time to review their options, however if we are to proceed I would like to administer loading dose of DAPT and proceed with treatment later  today.   [name], MD [facility]

## 2021-01-06 NOTE — Brief Op Note (Signed)
  NEUROSURGERY BRIEF OPERATIVE  NOTE   PREOP DX: Subarachnoid Hemorrhage  POSTOP DX: Same  PROCEDURE: Diagnostic cerebral angiogram, Pipeline embolization of right vertebral artery aneurysm  SURGEON: Dr. [name], MD  ANESTHESIA: GETA  EBL: Minimal  SPECIMENS: None  COMPLICATIONS: None  CONDITION: Hemodynamically stable to neuro ICU  FINDINGS (Full report in [facility]): 1. Successful Pipeline Shield embolization of RVA aneurysm. No immediate complications noted.   [name], MD [facility]
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
## 2021-01-06 NOTE — Anesthesia Preprocedure Evaluation (Addendum)
Anesthesia Evaluation  Patient identified by MRN, date of birth, ID band Patient unresponsive    Reviewed: Allergy & Precautions, NPO status , Patient's Chart, lab work & pertinent test results  Airway Mallampati: Intubated       Dental   Pulmonary former smoker,  intubated   breath sounds clear to auscultation       Cardiovascular + Peripheral Vascular Disease   Rhythm:Regular Rate:Normal     Neuro/Psych  Neuromuscular disease [facility])    GI/Hepatic negative GI ROS, Neg liver ROS,   Endo/Other  negative endocrine ROS  Renal/GU negative Renal ROS     Musculoskeletal  (+) Arthritis ,   Abdominal   Peds  Hematology  (+) anemia ,   Anesthesia Other Findings   Reproductive/Obstetrics                           Lab Results  Component Value Date   WBC 11.8 (H) January 25, 2021   HGB 9.9 (L) January 25, 2021   HCT 29.0 (L) January 25, 2021   MCV 85.6 2021-01-25   PLT 179 January 25, 2021   Lab Results  Component Value Date   CREATININE 1.16 25-Jan-2021   BUN 19 25-Jan-2021   NA 136 Jan 25, 2021   K 3.4 (L) 01/25/2021   CL 103 01-25-21   CO2 21 (L) 01/25/2021    Anesthesia Physical Anesthesia Plan  ASA: IV  Anesthesia Plan: General   Post-op Pain Management:    Induction: Intravenous  PONV Risk Score and Plan: 2 and Dexamethasone, Ondansetron and Treatment may vary due to age or medical condition  Airway Management Planned: Oral ETT  Additional Equipment: Arterial line  Intra-op Plan:   Post-operative Plan: Post-operative intubation/ventilation  Informed Consent: I have reviewed the patients History and Physical, chart, labs and discussed the procedure including the risks, benefits and alternatives for the proposed anesthesia with the patient or authorized representative who has indicated his/her understanding and acceptance.     Dental advisory given  Plan Discussed with: CRNA  Anesthesia Plan  Comments: (Cleviprex gtt)     Anesthesia Quick Evaluation

## 2021-01-07 ENCOUNTER — Inpatient Hospital Stay (HOSPITAL_COMMUNITY): Payer: 59

## 2021-01-07 ENCOUNTER — Encounter (HOSPITAL_COMMUNITY): Payer: Self-pay | Admitting: Neurosurgery

## 2021-01-07 DIAGNOSIS — J9601 Acute respiratory failure with hypoxia: Secondary | ICD-10-CM | POA: Diagnosis not present

## 2021-01-07 DIAGNOSIS — J690 Pneumonitis due to inhalation of food and vomit: Secondary | ICD-10-CM | POA: Diagnosis not present

## 2021-01-07 DIAGNOSIS — I609 Nontraumatic subarachnoid hemorrhage, unspecified: Secondary | ICD-10-CM | POA: Diagnosis not present

## 2021-01-07 LAB — MAGNESIUM
Magnesium: 2.4 mg/dL (ref 1.7–2.4)
Magnesium: 2.5 mg/dL — ABNORMAL HIGH (ref 1.7–2.4)

## 2021-01-07 LAB — BASIC METABOLIC PANEL
Anion gap: 8 (ref 5–15)
BUN: 21 mg/dL (ref 8–23)
CO2: 19 mmol/L — ABNORMAL LOW (ref 22–32)
Calcium: 8 mg/dL — ABNORMAL LOW (ref 8.9–10.3)
Chloride: 107 mmol/L (ref 98–111)
Creatinine, Ser: 1.16 mg/dL (ref 0.61–1.24)
GFR, Estimated: >60 mL/min (ref >60)
Glucose, Bld: 178 mg/dL — ABNORMAL HIGH (ref 70–99)
Potassium: 4.1 mmol/L (ref 3.5–5.1)
Sodium: 134 mmol/L — ABNORMAL LOW (ref 135–145)

## 2021-01-07 LAB — GLUCOSE, CAPILLARY
Glucose-Capillary: 136 mg/dL — ABNORMAL HIGH (ref 70–99)
Glucose-Capillary: 192 mg/dL — ABNORMAL HIGH (ref 70–99)
Glucose-Capillary: 206 mg/dL — ABNORMAL HIGH (ref 70–99)
Glucose-Capillary: 215 mg/dL — ABNORMAL HIGH (ref 70–99)
Glucose-Capillary: 240 mg/dL — ABNORMAL HIGH (ref 70–99)
Glucose-Capillary: 278 mg/dL — ABNORMAL HIGH (ref 70–99)

## 2021-01-07 LAB — CBC
HCT: 31.2 % — ABNORMAL LOW (ref 39.0–52.0)
Hemoglobin: 10.6 g/dL — ABNORMAL LOW (ref 13.0–17.0)
MCH: 29.8 pg (ref 26.0–34.0)
MCHC: 34 g/dL (ref 30.0–36.0)
MCV: 87.6 fL (ref 80.0–100.0)
Platelets: 171 10*3/uL (ref 150–400)
RBC: 3.56 MIL/uL — ABNORMAL LOW (ref 4.22–5.81)
RDW: 12.9 % (ref 11.5–15.5)
WBC: 12.2 10*3/uL — ABNORMAL HIGH (ref 4.0–10.5)
nRBC: 0 % (ref 0.0–0.2)

## 2021-01-07 LAB — PHOSPHORUS
Phosphorus: 3.2 mg/dL (ref 2.5–4.6)
Phosphorus: 2.5 mg/dL (ref 2.5–4.6)

## 2021-01-07 MED ORDER — FENTANYL CITRATE (PF) 100 MCG/2ML IJ SOLN
25.0000 ug | INTRAMUSCULAR | Status: DC | PRN
  Administered 2021-01-07 – 2021-01-08 (×4): 50 ug via INTRAVENOUS
  Filled 2021-01-07 (×4): qty 2

## 2021-01-07 MED ORDER — ASPIRIN 325 MG PO TABS
325.0000 mg | ORAL_TABLET | Freq: Every day | ORAL | Status: DC
  Administered 2021-01-07 – 2021-01-15 (×9): 325 mg
  Filled 2021-01-07 (×9): qty 1

## 2021-01-07 MED ORDER — LISINOPRIL 10 MG PO TABS
10.0000 mg | ORAL_TABLET | Freq: Every day | ORAL | Status: DC
  Administered 2021-01-07: 10 mg
  Filled 2021-01-07: qty 1

## 2021-01-07 MED ORDER — CLOPIDOGREL BISULFATE 75 MG PO TABS
75.0000 mg | ORAL_TABLET | Freq: Every day | ORAL | Status: DC
  Administered 2021-01-07 – 2021-01-15 (×9): 75 mg
  Filled 2021-01-07 (×9): qty 1

## 2021-01-07 NOTE — Procedures (Signed)
Cortrak  Person Inserting Tube:  Osa Craver, RD Tube Type:  Cortrak - 43 inches Tube Location:  Right nare Initial Placement:  Stomach Secured by: Bridle Technique Used to Measure Tube Placement:  Documented cm marking at nare/ corner of mouth Cortrak Secured At:  67 cm   Cortrak Tube Team Note:  Consult received to place a Cortrak feeding tube.   X-ray is required, abdominal x-ray has been ordered by the Cortrak team. Please confirm tube placement before using the Cortrak tube.   If the tube becomes dislodged please keep the tube and contact the Cortrak team at [email or web address] ([email or web address]) for replacement.  If after hours and replacement cannot be delayed, place a NG tube and confirm placement with an abdominal x-ray.    Romelle Starcher MS, RDN, LDN, CNSC Registered Dietitian III Clinical Nutrition RD Pager and On-Call Pager Number Located in [place]

## 2021-01-07 NOTE — Progress Notes (Signed)
Patient transported to CT and back without complications. RN at bedside.  

## 2021-01-07 NOTE — Progress Notes (Signed)
  PCCM Interval Note  Patient had been weaning well on PSV 5/5 and fentanyl gtt off for over one hour, still getting great TV and RR normal, but slight drop in sats at 92%, went in to evaluate for extubation, patient less responsive, would wake up to deep noxious stimuli and sluggish but still able to follow commands.  EVD remains patent.  Plans were to proceed with extubation for trial, however he vomited and desaturated.    Placed back on full support.  OGT placed to suction.  TF held.  No further hemodynamic changes.  Pupils unchanged.   With changes in mental status, discussed with Dr. Merrily Pew, who discussed with NSGY.  Will go for stat CTH to rule out any further bleed.     Wife updated at bedside.       Shawn Lane, ACNP [place] 01/07/2021, 11:36 AM

## 2021-01-07 NOTE — Progress Notes (Signed)
eLink Physician-Brief Progress Note Patient Name: Shawn Lane DOB: April 10, 1961 MRN: [id number]   Date of Service  01/07/2021  HPI/Events of Note  Patient is not comfortable per Nursing. Fentanyl IV infusion turned off today in an effort to try to extubate the patient which did not happen.   eICU Interventions  Plan: 1. Fentanyl 25-50 mcg IV Q 2 hours PRN pain.      Intervention Category Major Interventions: Other:  Lenell Antu 01/07/2021, 9:48 PM

## 2021-01-07 NOTE — Progress Notes (Addendum)
NAME:  Shawn Lane, MRN:  [id number], DOB:  27-Jun-1961, LOS: 3 ADMISSION DATE:  12/21/2020, CONSULTATION DATE:  5/17 REFERRING MD:  Conchita Paris, CHIEF COMPLAINT:  Respiratory failure s/p SAH   History of Present Illness:  60 year old male who first presented to the ER 5/16 w/ cc: worse head ache of life. Reported on 5/14 at [facility] tournament received a blow to his left jaw and below the skull just above the right ear. No pain immediately after. Had HA on 5/15 w/ slight right sided HA that was progressive. then Woke up with worst head ache of life on 5/16. Went to urgent care & was referred to ER for CT brain and C spine which was completely normal.   On 5/17 called EMS w/ worsening HA. On EMS arrival diaphoretic and in acute distress. While on way to ED complained of worsening HA and neck pain w/ noted hypertension. Became unresponsive and stopped breathing requiring assisted ventilation. He was intubated. By EDP on arrival to ER. STAT CT head showed diffuse SAH w/ associated hydrocephalus. Initial neuro exam was very minimal. Had cough and decorticate posturing of LEs w/ stimulation. He was seen by neuro surgical team w/ plan to proceed w/ ventriculostomy drain prior to surgical intervention. PCCM asked to assist w/care.  Started on unasyn for suspected aspiration.  Cultures sent.  R subclavian CVL and right aline  5/18-> Patient had extensor posturing last night, EVD was lowered from 15-10 and this morning it was lowered to 0, CSF is trending.  CT head was done last night which shows increasing hydrocephalus, despite EVD remain in good position and draining well.   TCD neg.  EEG neg  5/19 -> better neuro exam following commands L> R but still having abnormal extensor movements as well, EVD at 0, TCDs/ EEG neg, afebrile, remains on cleviprex, failed SBT due to bradypnea, not requiring sedation, went for successful pipeline shield embolization of RVA aneurysm   Interim History / Subjective:   S/p  successful pipeline shield embolization of RVA aneurysm 5/19 afternoon w/NSGY Fentanyl gtt added overnight by EMD for agitation, vent dyssynchrony and reaching for ETT both w/ UEs, currently at 50 mcg/hr Remains on cleviprex gtt at 14 mg/ hr Following specific commands today, and no more posturing noted EVD remains 0, slightly sluggish overnight but now draining well, 118 ml/ 24hrs Afebrile  UOP 1.3L/ 24 hrs +382 ml/ net - 714   Objective   Blood pressure (!) 122/111, pulse 77, temperature 98.06 F (36.7 C), resp. rate (!) 21, SpO2 98 %.    Vent Mode: PRVC FiO2 (%):  [40 %] 40 % Set Rate:  [16 bmp] 16 bmp Vt Set:  [610 mL] 610 mL PEEP:  [5 cmH20] 5 cmH20 Pressure Support:  [10 cmH20] 10 cmH20 Plateau Pressure:  [14 cmH20-22 cmH20] 22 cmH20   Intake/Output Summary (Last 24 hours) at 01/07/2021 8185 Last data filed at 01/07/2021 0800 Gross per 24 hour  Intake 2201.44 ml  Output 1559 ml  Net 642.44 ml   There were no vitals filed for this visit.  Examination: General:  Critically ill adult male lying in bed in NAD HEENT: MM pink/moist, ETT/ OGT, pupils 2/sluggish, some scleral edema, anicertic, R frontal EVD Neuro: will open eyes to verbal, follows commands briskly on left 5/5, thumbs up, LLE 4/5, flicker of toe movement in RLE, and light squeeze of hand on RUE / 3/5  CV: rr, NSR, no murmur, R [place] CVL PULM:  Non  labored, coarse L> R, mild thick tan secretions, +cough, flipped 10/5 doing well and tolerating thus far GI: soft, bs active, NT, foley- cyu Extremities: warm/dry, no LE edema  Skin: no rashes   Labs/imaging that I havepersonally reviewed  (right click and "Reselect all SmartList Selections" daily)   CBC, BMET, glucose trend Micro- ngtd BCx 2  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, 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 Obstructive hydrocephalus s/p EVD 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 S/p  pipeline shield embolization of RVA aneurysm 5/19 - per NSGY  - continue SBP goals of < 140 - ongoing cleviprex, adding enteral lisinopril - EVD per NSGY, at 0 - nimodipine 60mg   q 4hrs - ASA/ plavix started 5/20 2/2 pipeline shield embolization - ongoing keppra for seizure ppx - trending TCDs - Maintain neuro protective measures; goal for eurothermia, euglycemia, eunatermia, normoxia, and PCO2 goal of 35-40 - continue Nutrition and bowel regiment  - Seizure precautions  - further imaging per NSGY   - continue NS at 100 ml/hr - keep CVL and aline for now  - PT/ OT   Acute hypoxic respiratory failure with possible aspiration pneumonia - Continue MV support, 4-8cc/kg IBW with goal Pplat <30 and DP<15  - VAP prevention protocol/ PPI - PAD protocol for sedation> stop fentanyl gtt for hopeful extubation today, prn only -  FiO2 as able for SpO2 >92%  - weaning well, hopeful to extubate today - continue unasyn for 5 days (stop date 5/21) - SLP post extubation  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 Demand cardiac ischemia TTE 5/18, EF 60-65%, normal RV, indeterminate diastolic, ? small regional wall motion abnormalities (mid anteroseptum).  Trop hs trend 251- 702- 825.  EKG non acute.  amio gtt stopped 5/18.  Consider cardiology consult depending on neuro recovery  - remains NSR.  Continue tele - Mag goal > 2, K > 4  AKI, due to dehydration, resolved  - continue NS at 100 ml/hr - trend renal indices - remove foley, prn bladder scan   Hyponatremia - improving  - trend BMET  Diabetes, new diagnosis - A1c 6.5 - continue SSI moderate - continue TF coverage 3 units q 4  Best practice (right click and "Reselect all SmartList Selections" daily)  Diet:  NPO,  TF 5/19 Pain/Anxiety/Delirium protocol (if indicated): Yes (RASS goal 0) VAP protocol (if indicated): Yes DVT prophylaxis: SCD only  GI prophylaxis: PPI Glucose control:  SSI Yes Central venous  access:  Yes, and it is still needed Arterial line:  N/A Foley:  Yes, and it is no longer needed  Mobility:  bed rest  PT consulted: N/A Last date of multidisciplinary goals of care discussion [Per primary team] Code Status:  full code Disposition: ICU   Labs   CBC: Recent Labs  Lab 01-21-21 1606 Jan 21, 2021 1637 January 21, 2021 1654 01/05/21 0427 01/05/21 0450 01/17/2021 0827 12/29/2020 1118 01/07/21 0500  WBC 24.6* 12.7*  --  13.3*  --  11.8*  --  12.2*  HGB 14.5 11.3*   < > 12.6* 11.6* 11.1* 9.9* 10.6*  HCT 45.7 33.0*   < > 35.9* 34.0* 31.5* 29.0* 31.2*  MCV 93.3 87.5  --  83.9  --  85.6  --  87.6  PLT 262 168  --  197  --  179  --  171   < > = values in this interval not displayed.  Basic Metabolic Panel: Recent Labs  Lab 12/22/2020 1605 01/09/2021 1606 01/03/2021 1654 01/14/2021 1806 01/05/21 0427 01/05/21 0450 01/01/2021 0827 12/25/2020 1118 01/07/2021 2016 01/07/21 0500  NA 135 136   < >  --  132* 135 132* 136  --  134*  K 3.5 3.6   < >  --  3.3* 3.3* 3.5 3.4*  --  4.1  CL 103 100  --   --  100  --  103  --   --  107  CO2  --  20*  --   --  21*  --  21*  --   --  19*  GLUCOSE 256* 268*  --   --  168*  --  173*  --   --  178*  BUN 18 17  --   --  14  --  19  --   --  21  CREATININE 1.40* 1.64*  --   --  1.02  --  1.16  --   --  1.16  CALCIUM  --  8.7*  --   --  8.4*  --  7.9*  --   --  8.0*  MG  --   --   --  1.6* 1.7  --  2.1  --  2.1 2.4  PHOS  --   --   --   --  2.7  --  2.8  --  2.7 3.2   < > = values in this interval not displayed.   GFR: Estimated Creatinine Clearance: 71.4 mL/min (by C-G formula based on SCr of 1.16 mg/dL). Recent Labs  Lab 01/12/2021 1606 01/12/2021 1637 01/05/21 0427 01/18/2021 0827 01/07/21 0500  WBC 24.6* 12.7* 13.3* 11.8* 12.2*  LATICACIDVEN 4.9*  --  1.3  --   --     Liver Function Tests: Recent Labs  Lab 12/26/2020 1606  AST 30  ALT 25  ALKPHOS 57  BILITOT 1.0  PROT 6.9  ALBUMIN 3.9   No results for input(s): LIPASE, AMYLASE in the last  168 hours. No results for input(s): AMMONIA in the last 168 hours.  ABG    Component Value Date/Time   PHART 7.434 01/12/2021 1118   PCO2ART 33.3 01/02/2021 1118   PO2ART 116 (H) 01/03/2021 1118   HCO3 22.3 12/27/2020 1118   TCO2 23 01/11/2021 1118   ACIDBASEDEF 2.0 01/02/2021 1118   O2SAT 99.0 01/05/2021 1118     Coagulation Profile: Recent Labs  Lab 01/15/2021 1606 01/12/2021 1637  INR 1.0 1.1    Cardiac Enzymes: No results for input(s): CKTOTAL, CKMB, CKMBINDEX, TROPONINI in the last 168 hours.  HbA1C: Hgb A1c MFr Bld  Date/Time Value Ref Range Status  01/05/2021 04:27 AM 6.5 (H) 4.8 - 5.6 % Final    Comment:    (NOTE) Pre diabetes:          5.7%-6.4%  Diabetes:              >6.4%  Glycemic control for   <7.0% adults with diabetes     CBG: Recent Labs  Lab 01/02/2021 1555 01/02/2021 1949 01/15/2021 2327 01/07/21 0328 01/07/21 0742  GLUCAP 143* 145* 172* 136* 192*    CCT:  32 mins   Posey Boyer, ACNP [facility] 01/07/2021, 8:28 AM

## 2021-01-07 NOTE — Progress Notes (Signed)
0800 patient alert, following all commands.  1000 fent gtt stopped and TF stopped for anticipated extubation; pt weaning at this time.  1115 pt desat 88% and vomit x1. EVD pulsitile; pupils unchanged, vitals stable; pt less alert though still following commands with more stimulation. OG placed to LIS. NP B. Simpson to bedside. Orders placed for stat head CT.

## 2021-01-07 NOTE — Progress Notes (Signed)
Transcranial Doppler  Date POD PCO2 HCT BP  MCA ACA PCA OPHT SIPH VERT Basilar  5/18 [facility]     Right  Left   37  40   -40  -28   28  28   24  22   31  23    -58  -33         5/20 [facility]     Right  Left   62  75   -34  -78   57  41   28  26   43  53   *  *   *           Right  Left                                             Right  Left                                             Right  Left                                            Right  Left                                            Right  Left                                        MCA = Middle Cerebral Artery      OPHT = Opthalmic Artery     BASILAR = Basilar Artery   ACA = Anterior Cerebral Artery     SIPH = Carotid Siphon PCA = Posterior Cerebral Artery   VERT = Verterbral Artery                   Normal MCA = 62+\-12 ACA = 50+\-12 PCA = 42+\-23   lindegard ratio: rt 2.30 lt 5.36  Stefhanie Kachmar 01/07/2021 11:42 AM

## 2021-01-07 NOTE — Anesthesia Postprocedure Evaluation (Signed)
Anesthesia Post Note  Patient: Shawn Lane  Procedure(s) Performed: IR WITH ANESTHESIA (N/A )     Patient location during evaluation: SICU Anesthesia Type: General Level of consciousness: sedated Pain management: pain level controlled Vital Signs Assessment: post-procedure vital signs reviewed and stable Respiratory status: patient remains intubated per anesthesia plan Cardiovascular status: stable Postop Assessment: no apparent nausea or vomiting Anesthetic complications: no   No complications documented.  Last Vitals:  Vitals:   01/07/21 0810 01/07/21 0830  BP:    Pulse:  75  Resp:    Temp:  36.7 C  SpO2: 98% 98%    Last Pain:  Vitals:   01/07/21 0800  TempSrc: Bladder                 Kennieth Rad

## 2021-01-08 ENCOUNTER — Inpatient Hospital Stay (HOSPITAL_COMMUNITY): Payer: 59

## 2021-01-08 DIAGNOSIS — J690 Pneumonitis due to inhalation of food and vomit: Secondary | ICD-10-CM | POA: Diagnosis not present

## 2021-01-08 DIAGNOSIS — J9601 Acute respiratory failure with hypoxia: Secondary | ICD-10-CM | POA: Diagnosis not present

## 2021-01-08 DIAGNOSIS — I609 Nontraumatic subarachnoid hemorrhage, unspecified: Secondary | ICD-10-CM | POA: Diagnosis not present

## 2021-01-08 LAB — GLUCOSE, CAPILLARY
Glucose-Capillary: 212 mg/dL — ABNORMAL HIGH (ref 70–99)
Glucose-Capillary: 194 mg/dL — ABNORMAL HIGH (ref 70–99)
Glucose-Capillary: 248 mg/dL — ABNORMAL HIGH (ref 70–99)
Glucose-Capillary: 207 mg/dL — ABNORMAL HIGH (ref 70–99)
Glucose-Capillary: 215 mg/dL — ABNORMAL HIGH (ref 70–99)
Glucose-Capillary: 195 mg/dL — ABNORMAL HIGH (ref 70–99)

## 2021-01-08 LAB — CBC
HCT: 30.6 % — ABNORMAL LOW (ref 39.0–52.0)
Hemoglobin: 10.4 g/dL — ABNORMAL LOW (ref 13.0–17.0)
MCH: 30 pg (ref 26.0–34.0)
MCHC: 34 g/dL (ref 30.0–36.0)
MCV: 88.2 fL (ref 80.0–100.0)
Platelets: 256 10*3/uL (ref 150–400)
RBC: 3.47 MIL/uL — ABNORMAL LOW (ref 4.22–5.81)
RDW: 13.3 % (ref 11.5–15.5)
WBC: 16.8 10*3/uL — ABNORMAL HIGH (ref 4.0–10.5)
nRBC: 0 % (ref 0.0–0.2)

## 2021-01-08 LAB — BASIC METABOLIC PANEL
Anion gap: 9 (ref 5–15)
BUN: 30 mg/dL — ABNORMAL HIGH (ref 8–23)
CO2: 20 mmol/L — ABNORMAL LOW (ref 22–32)
Calcium: 8.5 mg/dL — ABNORMAL LOW (ref 8.9–10.3)
Chloride: 108 mmol/L (ref 98–111)
Creatinine, Ser: 1.42 mg/dL — ABNORMAL HIGH (ref 0.61–1.24)
GFR, Estimated: 56 mL/min — ABNORMAL LOW (ref >60)
Glucose, Bld: 260 mg/dL — ABNORMAL HIGH (ref 70–99)
Potassium: 3.8 mmol/L (ref 3.5–5.1)
Sodium: 137 mmol/L (ref 135–145)

## 2021-01-08 LAB — TRIGLYCERIDES: Triglycerides: 147 mg/dL (ref <150)

## 2021-01-08 MED ORDER — INSULIN DETEMIR 100 UNIT/ML ~~LOC~~ SOLN
5.0000 [IU] | Freq: Two times a day (BID) | SUBCUTANEOUS | Status: DC
  Administered 2021-01-08 – 2021-01-09 (×3): 5 [IU] via SUBCUTANEOUS
  Filled 2021-01-08 (×4): qty 0.05

## 2021-01-08 MED ORDER — INSULIN ASPART 100 UNIT/ML IJ SOLN
5.0000 [IU] | INTRAMUSCULAR | Status: DC
  Administered 2021-01-08 – 2021-01-09 (×6): 5 [IU] via SUBCUTANEOUS

## 2021-01-08 MED ORDER — FENTANYL 2500MCG IN NS 250ML (10MCG/ML) PREMIX INFUSION
25.0000 ug/h | INTRAVENOUS | Status: DC
  Administered 2021-01-10 – 2021-01-11 (×2): 75 ug/h via INTRAVENOUS
  Administered 2021-01-12 (×2): 150 ug/h via INTRAVENOUS
  Administered 2021-01-13 (×2): 200 ug/h via INTRAVENOUS
  Administered 2021-01-14: 100 ug/h via INTRAVENOUS
  Administered 2021-01-15: 125 ug/h via INTRAVENOUS
  Administered 2021-01-16: 50 ug/h via INTRAVENOUS
  Filled 2021-01-08 (×10): qty 250

## 2021-01-08 MED ORDER — FENTANYL BOLUS VIA INFUSION
25.0000 ug | INTRAVENOUS | Status: DC | PRN
  Administered 2021-01-09: 100 ug via INTRAVENOUS
  Administered 2021-01-12 – 2021-01-14 (×7): 50 ug via INTRAVENOUS
  Filled 2021-01-08: qty 100

## 2021-01-08 MED ORDER — SODIUM CHLORIDE 0.9 % IV SOLN
3.0000 g | Freq: Four times a day (QID) | INTRAVENOUS | Status: DC
  Administered 2021-01-08 – 2021-01-10 (×7): 3 g via INTRAVENOUS
  Filled 2021-01-08 (×3): qty 3
  Filled 2021-01-08: qty 8
  Filled 2021-01-08 (×2): qty 3
  Filled 2021-01-08: qty 8
  Filled 2021-01-08 (×2): qty 3
  Filled 2021-01-08: qty 8

## 2021-01-08 MED ORDER — LABETALOL HCL 5 MG/ML IV SOLN: 10.0000 mg | INTRAVENOUS | Status: DC | PRN

## 2021-01-08 MED ORDER — FENTANYL CITRATE (PF) 100 MCG/2ML IJ SOLN: 25.0000 ug | Freq: Once | INTRAMUSCULAR | Status: DC

## 2021-01-08 MED ORDER — FENTANYL 2500MCG IN NS 250ML (10MCG/ML) PREMIX INFUSION
0.0000 ug/h | INTRAVENOUS | Status: DC
  Administered 2021-01-08: 25 ug/h via INTRAVENOUS
  Filled 2021-01-08: qty 250

## 2021-01-08 MED ORDER — POTASSIUM CHLORIDE 20 MEQ PO PACK
40.0000 meq | PACK | Freq: Once | ORAL | Status: AC
  Administered 2021-01-08: 40 meq
  Filled 2021-01-08: qty 2

## 2021-01-08 MED ORDER — LABETALOL HCL 100 MG PO TABS
100.0000 mg | ORAL_TABLET | Freq: Three times a day (TID) | ORAL | Status: DC
  Administered 2021-01-08 – 2021-01-09 (×4): 100 mg
  Filled 2021-01-08 (×4): qty 1

## 2021-01-08 NOTE — Progress Notes (Signed)
RT pulled ETT back 2 cm per CCM order. Patient had return of VT once balloon was inflated.

## 2021-01-08 NOTE — Progress Notes (Signed)
Neurosurgery Service Progress Note  Subjective: No acute events overnight   Objective: Vitals:   01/08/21 0700 01/08/21 0800 01/08/21 0900 01/08/21 1000  BP: 136/62 (!) 154/72 137/62 135/62  Pulse: 86 97 92 89  Resp:      Temp:  (!) 100.9 F (38.3 C)    TempSrc:  Axillary    SpO2: 92% 95% 92% 91%  Weight:        Physical Exam: Intubated, sedation held, eyes open to voice, FC on L better than R EVD in place and draining  Assessment & Plan: 60 y.o. 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  -no change in neurosurgical plan of care  Jadene Pierini  01/08/21 11:11 AM

## 2021-01-08 NOTE — Progress Notes (Addendum)
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   Significant Hospital Events: Including procedures, antibiotic start and stop dates in addition to other pertinent events    On 5/17 presenting with headache, altered mental status. STAT CT head showed diffuse SAH w/ associated hydrocephalus.  He was seen by neuro surgical team w/ plan to proceed w/ ventriculostomy drain prior to surgical intervention. PCCM asked to assist w/care.  Started on unasyn for suspected aspiration.  Cultures sent.  R subclavian CVL and right aline  5/18-> Patient had extensor posturing last night, EVD was lowered from 15-10 and this morning it was lowered to 0, CSF is trending.  CT head was done last night which shows increasing hydrocephalus, despite EVD remain in good position and draining well.   TCD neg.  EEG neg  5/19 -> better neuro exam following commands L> R but still having abnormal extensor movements as well, EVD at 0, TCDs/ EEG neg, afebrile, remains on cleviprex, failed SBT due to bradypnea, not requiring sedation, went for successful pipeline shield embolization of RVA aneurysm   5/20 -> S/p successful pipeline shield embolization of RVA aneurysm 5/19 afternoon w/NSGY.  Fentanyl gtt added overnight by EMD for agitation, vent dyssynchrony and reaching for ETT both w/ UEs, currently at 50 mcg/hr.   Remains on cleviprex gtt at 14 mg/ hr, lisinopril added.  Following specific commands today, and no more posturing noted-> had epidsode of decreased responsive, CTH repeated, stable, expected changes.   Vomited x 1- sats low 90s.  EVD remains 0, slightly sluggish overnight but now draining well, 118 ml/ 24hrs.  Afebrile, UOP 1.3L/ 24 hrs, trial of foley removal, +382 ml/ net - 714  Interim History / Subjective:   Uncomfortable overnight requiring multiple fentanyl boluses cleviprex at max  21 mg/hr for SBP goal < 160 Required I/O cath x 2 (I/Os inaccurate per RN from overnight) sCr 1.16-> 1.42 EVD output 8-10 ml/hr tmax 100.9 ax / WBC 12.2-> 16.8 Copious trach/ oral secretions, up to 50% on FiO2 and labored at times on full support No further vomiting episodes No reported BM despite scheduled bowel regimen, KUB 5/20 neg sCr 1.16-> 1.42 Glucose trend remains > 180  Objective   Blood pressure 136/62, pulse 86, temperature 100 F (37.8 C), temperature source Oral, resp. rate 18, weight 93.6 kg, SpO2 92 %.    Vent Mode: PRVC FiO2 (%):  [40 %] 40 % Set Rate:  [16 bmp] 16 bmp Vt Set:  [610 mL] 610 mL PEEP:  [5 cmH20] 5 cmH20 Pressure Support:  [5 cmH20-10 cmH20] 5 cmH20 Plateau Pressure:  [14 cmH20-24 cmH20] 19 cmH20   Intake/Output Summary (Last 24 hours) at 01/08/2021 7939 Last data filed at 01/08/2021 0700 Gross per 24 hour  Intake 2432.15 ml  Output 921 ml  Net 1511.15 ml   Filed Weights   01/08/21 0445  Weight: 93.6 kg    Examination: General:  Critically ill adult male in moderate discomfort and tachypnea on full MV HEENT: MM pink/moist, ETT at 25/26, cortrak, pupils 2/sluggish, right frontal EVD Neuro: slowly attempts to  open eyes R>L otherwise keeps closed, f/c in all extremities L> R, LUE/ LLE 3-4/5, wiggles toes RLE and light R hand squeeze CV: NSR, no murmur PULM:  Labored, copious oral and trach secretions- thick tan, rhonchi throughout, +cough GI: soft, bs+, NT, condom cath Extremities: warm/dry, generalized +1 edema  Skin: no rashes    Labs/imaging that I havepersonally reviewed  (right click and "Reselect all SmartList Selections" daily)  5/20 CTH - stable  SAH/ EVD, moderate IVH, new 2cm hypodensity in right cerebellar hemisphere- expected with pipeline embolization  CBC, BMET, glucose trend CXR 5/21 neg, considered ETT retraction 1.5-2cm, unclear if done, neg for opacity/ edema 5/20 TCD pending  5/17 BCx 2 >> ngtd 5/21 trach asp >>  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  Avoid apresoline with SAH.  - EVD per NSGY, at 0, ongoing 8-10 ml/hr average - nimodipine 60mg   q 4hrs - ASA/ plavix started 5/20 2/2 pipeline shield embolization - ongoing keppra for seizure ppx - trending TCDs (MWF) - Maintain neuro protective measures; goal for eurothermia, euglycemia, eunatermia, normoxia, and PCO2 goal of 35-40 - continue Nutrition and scheduled bowel regiment (daily miralax and colace) per cortrak - Seizure precautions  - further imaging per NSGY   - continue NS at 100 ml/hr - keep CVL and aline for now  - PT/ OT   Acute hypoxic respiratory failure with possible aspiration pneumonia/ pneumonitis  - repeated aspiration event 5/20 with vomiting episode - Continue MV support, 4-8cc/kg IBW with goal Pplat <30 and DP<15  - VAP prevention protocol/ PPI - PAD protocol for sedation>adding back low dose fentanyl gtt for comfort today, RASS goal 0/-1 -  FiO2 as able for SpO2 >92%  -  Hold on SBT this am given current increased WOB on full support and copious secretions.  Will send trach asp (never sent), continue unasyn per pharmacy, extend for 7 days total    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 Demand cardiac ischemia TTE  5/18, EF 60-65%, normal RV, indeterminate diastolic, ? small regional wall motion abnormalities (mid anteroseptum).  Trop hs trend 251- 702- 825.  EKG non acute.  amio gtt stopped 5/18.  Consider cardiology consult depending on neuro recovery  - remains NSR.  Continue tele - Mag goal > 2, K > 4  AKI, due to dehydration, resolved  - sCr up 5/21, could be component of obstructive given frequent I/Os, urinary retention after foley trial out 5/20.  Will replace foley today.  - continue NS at 100 ml/hr - trend renal indices  Hyponatremia- resolved - trend BMET  Diabetes, new diagnosis - A1c 6.5 - remains above goal 180 - continue SSI moderate - increase TF coverage 3-> 5 units q 4 hr - add levemir 5 units BID  Leukocytosis- from suspected aspiration - follow cultures- BC 1/3 w/GPC, suspected contamination - trach asp today - continue unasyn    Best practice (right click and "Reselect all SmartList Selections" daily)  Diet:  NPO,  TF 5/19, cortrak placed 5/20 Pain/Anxiety/Delirium protocol (if indicated): Yes (RASS goal 0) VAP protocol (if indicated): Yes DVT  prophylaxis: SCD only  GI prophylaxis: PPI Glucose control:  SSI Yes Central venous access:  Yes, and it is still needed Arterial line:  N/A Foley:  N/A  Mobility:  bed rest  PT consulted: N/A Last date of multidisciplinary goals of care discussion [Per primary team] Code Status:  full code Disposition: ICU   Critical care time:    CCT:  35 mins   Posey Boyer, ACNP [facility] 01/08/2021, 8:07 AM

## 2021-01-08 NOTE — Progress Notes (Signed)
PHARMACY - PHYSICIAN COMMUNICATION CRITICAL VALUE ALERT - BLOOD CULTURE IDENTIFICATION (BCID)  Shawn Lane is an 60 y.o. male who presented to [facility] on Jan 27, 2021 with a chief complaint of headache, found to have SAH  Assessment: 1 YOM with SAH and hydrocephalus on Unasyn for concern for aspiration PNA now with 1 anaerobic bottle of 3 total growing GPR - thought to be likely contamination. No BCID done  Name of physician (or Provider) Contacted: Posey Boyer, NP  Current antibiotics: Unasyn  Changes to prescribed antibiotics recommended:  None - continue Unasyn, will optimize dosing at q6h for aspiration coverage. Stop date entered for 7d coverage. Will continue to monitor blood culture results.   No results found for this or any previous visit.  Thank you for allowing pharmacy to be a part of this patient's care.  Georgina Pillion, PharmD, BCPS Clinical Pharmacist Clinical phone for 01/08/2021: [phone] 01/08/2021 9:51 AM   **Pharmacist phone directory can now be found on [email or web address] ([email or web address]).  Listed under [facility].

## 2021-01-09 DIAGNOSIS — I609 Nontraumatic subarachnoid hemorrhage, unspecified: Secondary | ICD-10-CM | POA: Diagnosis not present

## 2021-01-09 LAB — CBC
HCT: 29.7 % — ABNORMAL LOW (ref 39.0–52.0)
Hemoglobin: 9.7 g/dL — ABNORMAL LOW (ref 13.0–17.0)
MCH: 29.6 pg (ref 26.0–34.0)
MCHC: 32.7 g/dL (ref 30.0–36.0)
MCV: 90.5 fL (ref 80.0–100.0)
Platelets: 201 10*3/uL (ref 150–400)
RBC: 3.28 MIL/uL — ABNORMAL LOW (ref 4.22–5.81)
RDW: 13.4 % (ref 11.5–15.5)
WBC: 10.4 10*3/uL (ref 4.0–10.5)
nRBC: 0.2 % (ref 0.0–0.2)

## 2021-01-09 LAB — GLUCOSE, CAPILLARY
Glucose-Capillary: 232 mg/dL — ABNORMAL HIGH (ref 70–99)
Glucose-Capillary: 222 mg/dL — ABNORMAL HIGH (ref 70–99)
Glucose-Capillary: 309 mg/dL — ABNORMAL HIGH (ref 70–99)
Glucose-Capillary: 170 mg/dL — ABNORMAL HIGH (ref 70–99)
Glucose-Capillary: 218 mg/dL — ABNORMAL HIGH (ref 70–99)
Glucose-Capillary: 265 mg/dL — ABNORMAL HIGH (ref 70–99)

## 2021-01-09 LAB — BASIC METABOLIC PANEL
Anion gap: 6 (ref 5–15)
BUN: 32 mg/dL — ABNORMAL HIGH (ref 8–23)
CO2: 22 mmol/L (ref 22–32)
Calcium: 8.2 mg/dL — ABNORMAL LOW (ref 8.9–10.3)
Chloride: 114 mmol/L — ABNORMAL HIGH (ref 98–111)
Creatinine, Ser: 1.36 mg/dL — ABNORMAL HIGH (ref 0.61–1.24)
GFR, Estimated: 59 mL/min — ABNORMAL LOW (ref >60)
Glucose, Bld: 255 mg/dL — ABNORMAL HIGH (ref 70–99)
Potassium: 4.6 mmol/L (ref 3.5–5.1)
Sodium: 142 mmol/L (ref 135–145)

## 2021-01-09 LAB — CULTURE, BLOOD (ROUTINE X 2): Culture: NO GROWTH

## 2021-01-09 MED ORDER — INSULIN ASPART 100 UNIT/ML IJ SOLN
7.0000 [IU] | INTRAMUSCULAR | Status: DC
  Administered 2021-01-09 – 2021-01-16 (×38): 7 [IU] via SUBCUTANEOUS

## 2021-01-09 MED ORDER — LABETALOL HCL 100 MG PO TABS
150.0000 mg | ORAL_TABLET | Freq: Three times a day (TID) | ORAL | Status: DC
  Administered 2021-01-09 – 2021-01-10 (×3): 150 mg
  Filled 2021-01-09 (×3): qty 2

## 2021-01-09 MED ORDER — LABETALOL HCL 5 MG/ML IV SOLN
10.0000 mg | INTRAVENOUS | Status: DC | PRN
  Administered 2021-01-10: 20 mg via INTRAVENOUS
  Filled 2021-01-09: qty 4

## 2021-01-09 MED ORDER — BISACODYL 10 MG RE SUPP
10.0000 mg | Freq: Once | RECTAL | Status: AC
  Administered 2021-01-09: 10 mg via RECTAL
  Filled 2021-01-09: qty 1

## 2021-01-09 MED ORDER — LABETALOL HCL 100 MG PO TABS: 200.0000 mg | ORAL_TABLET | Freq: Three times a day (TID) | ORAL | Status: DC

## 2021-01-09 MED ORDER — INSULIN DETEMIR 100 UNIT/ML ~~LOC~~ SOLN
10.0000 [IU] | Freq: Two times a day (BID) | SUBCUTANEOUS | Status: DC
  Administered 2021-01-09 – 2021-01-16 (×14): 10 [IU] via SUBCUTANEOUS
  Filled 2021-01-09 (×15): qty 0.1

## 2021-01-09 NOTE — Progress Notes (Signed)
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   Significant Hospital Events: Including procedures, antibiotic start and stop dates in addition to other pertinent events    On 5/17 presenting with headache, altered mental status. STAT CT head showed diffuse SAH w/ associated hydrocephalus.  He was seen by neuro surgical team w/ plan to proceed w/ ventriculostomy drain prior to surgical intervention. PCCM asked to assist w/care.  Started on unasyn for suspected aspiration.  Cultures sent.  R subclavian CVL and right aline  5/18-> Patient had extensor posturing last night, EVD was lowered from 15-10 and this morning it was lowered to 0, CSF is trending.  CT head was done last night which shows increasing hydrocephalus, despite EVD remain in good position and draining well.   TCD neg.  EEG neg  5/19 -> better neuro exam following commands L> R but still having abnormal extensor movements as well, EVD at 0, TCDs/ EEG neg, afebrile, remains on cleviprex, failed SBT due to bradypnea, not requiring sedation, went for successful pipeline shield embolization of RVA aneurysm   5/20 -> S/p successful pipeline shield embolization of RVA aneurysm 5/19 afternoon w/NSGY.  Fentanyl gtt added overnight by EMD for agitation, vent dyssynchrony and reaching for ETT both w/ UEs, currently at 50 mcg/hr.   Remains on cleviprex gtt at 14 mg/ hr, lisinopril added.  Following specific commands today, and no more posturing noted-> had epidsode of decreased responsive, CTH repeated, stable, expected  changes.  Vomited x 1- sats low 90s.  EVD remains 0, slightly sluggish overnight but now draining well, 118 ml/ 24hrs.  Afebrile, UOP 1.3L/ 24 hrs, trial of foley removal, +382 ml/ net - 714  Interim History / Subjective:   Remains on fentanyl low dose for ongoing agtiation and occasional vent dyssynchrony Ongoing secretions, down from 70-> 50% tmax 101.9 cleviprex ranges from 2-20 mg/hr (up with stimulation and family at bedside) Still no BM   Objective   Blood pressure (!) 151/62, pulse 89, temperature (!) 101.9 F (38.8 C), temperature source Axillary, resp. rate (!) 25, weight 94.5 kg, SpO2 92 %.    Vent Mode: PRVC FiO2 (%):  [50 %-70 %] 60 % Set Rate:  [16 bmp] 16 bmp Vt Set:  [610 mL] 610 mL PEEP:  [5 cmH20] 5 cmH20 Plateau Pressure:  [15 cmH20-18 cmH20] 18 cmH20   Intake/Output Summary (Last 24 hours) at 01/09/2021 1002 Last data filed at 01/09/2021 0800 Gross per 24 hour  Intake 3843.45 ml  Output 2564 ml  Net 1279.45 ml   Filed Weights   01/08/21 0445 01/09/21 0400  Weight: 93.6 kg 94.5 kg    Examination: General:  Critically ill adult male lying in bed in NAD HEENT: MM pink/moist, ETT/ cortrak, pupils 2/sluggish, some scleral edema, right frontal EVD at 0 Neuro: opens eyes to voice, f/c L > R(no movement to gravity) CV: NSR, no murmur PULM:  Non labored currently, some dyssynchrony at times- due to agitation GI: soft, bs hyper active, NT, foley  Extremities: warm/dry, generalized +1 edema  Skin: no rashes  Labs/imaging that I havepersonally reviewed  (right click and "Reselect all SmartList Selections" daily)  5/20 CTH - stable SAH/ EVD, moderate IVH, new 2cm hypodensity in right cerebellar hemisphere- expected with pipeline embolization  5/20 TCD > neg  5/17 BCx 2 >> 1/3 GPC- likely contaminate 5/21 trach asp >>  GNR >>  CBC- improving WBC BMET- sCr 1.42-> 1.36, Cl 114, Na 142 Glucose trend remains > 200  Resolved Hospital Problem list      Assessment & Plan:  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, 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 Obstructive hydrocephalus s/p EVD 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 Hypertension  S/p  pipeline shield embolization of RVA aneurysm 5/19 - per NSGY  - continue SBP goals of < 160 - ongoing cleviprex, at 20 mg/hr today although mostly requiring 2mg /hr overnight, but up with family at bedside/ stimulation during the day  - will increase labetalol 100-> 150mg  TID with prn labetalol  - increase prn labetalol to 20 mg IVP prn  - avoid apresoline with SAH - continue EVD, continued at 0 - nimodipine 60mg   q 4hrs - ASA/ plavix started 5/20 2/2 pipeline shield embolization - ongoing keppra for seizure ppx - trending TCDs (MWF) - Maintain neuro protective measures; goal for eurothermia, euglycemia, eunatermia, normoxia, and PCO2 goal of 35-40 - continue Nutrition and scheduled bowel regiment (daily miralax and colace) per cortrak, no BM thus far, adding dulcolax 5/22 - Seizure precautions  - further imaging per NSGY   - keep CVL and aline for now  - PT/ OT - guarded prognosis    Acute hypoxic respiratory failure with possible aspiration pneumonia/ pneumonitis  Repeated aspiration event 5/20 with vomiting episode - Continue MV support, 4-8cc/kg IBW with goal Pplat <30 and DP<15  - VAP prevention protocol/ PPI - PAD protocol for sedation> continue low dose fentanyl for comfort. RASS goal 0/-1 -  FiO2 as able for SpO2 >92%  -  Not ready for SBT given increased ongoing increased FiO2 needs/ vent dyssynchrony- likely due to agitation.  Hold off adding enteral at this time, to avoid ileus, continue with short acting low dose fentanyl - intermittent CXR   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 Demand cardiac ischemia TTE 5/18, EF 60-65%, normal RV, indeterminate diastolic, ? small regional wall motion abnormalities (mid anteroseptum).  Trop hs trend 251-  702- 825.  EKG non acute.  amio gtt stopped 5/18.  Consider cardiology consult depending on neuro recovery  - remains NSR.  Continue tele - Mag goal > 2, K > 4  AKI - initially due to dehydration, resolved; increased sCr 5/21 after foley removed; foley replaced 5/21 - improving renal function and good UOP - trend renal indices/ strict I/Os - Replace electrolytes as indicated - Avoid nephrotoxic agents, ensure adequate renal perfusion\  Hyponatremia Hyperchloremic  - stop MIVF  - trend BMET  Diabetes, new diagnosis - A1c 6.5 - still above > 200 - continue SSI moderate - increase TF coverage 5-> 7 units q 4hr - increase levemir 5-> 10 units BID  Leukocytosis- from suspected aspiration, improving - ongoing fever likely neurogenic 5/22 - ongoing tylenol and prn cooling blanket for fever - follow cultures- BC 1/3 w/GPC, suspected contamination - follow trach asp  - continue unasyn for 7 days  Normocytic anemia - H/H stable - trend CBC, transfuse for < 7  Best practice (right click and "Reselect all SmartList Selections" daily)  Diet:  NPO,  TF 5/19, cortrak placed 5/20  Pain/Anxiety/Delirium protocol (if indicated): Yes (RASS goal 0) VAP protocol (if indicated): Yes DVT prophylaxis: SCD only  GI prophylaxis: PPI Glucose control:  SSI Yes Central venous access:  Yes, and it is still needed Arterial line:  N/A Foley:  Yes, and it is still needed  Mobility:  bed rest  PT consulted: N/A Last date of multidisciplinary goals of care discussion [Per primary team] Code Status:  full code Disposition: ICU   Wife and mother updated at bedside 5/22  Critical care time: 35 mins      Posey Boyer, ACNP [facility] 01/09/2021, 10:02 AM

## 2021-01-10 ENCOUNTER — Inpatient Hospital Stay (HOSPITAL_COMMUNITY): Payer: 59

## 2021-01-10 DIAGNOSIS — I609 Nontraumatic subarachnoid hemorrhage, unspecified: Secondary | ICD-10-CM

## 2021-01-10 LAB — POCT I-STAT 7, (LYTES, BLD GAS, ICA,H+H)
Acid-base deficit: 4 mmol/L — ABNORMAL HIGH (ref 0.0–2.0)
Bicarbonate: 20.9 mmol/L (ref 20.0–28.0)
Calcium, Ion: 1.21 mmol/L (ref 1.15–1.40)
HCT: 23 % — ABNORMAL LOW (ref 39.0–52.0)
Hemoglobin: 7.8 g/dL — ABNORMAL LOW (ref 13.0–17.0)
O2 Saturation: 93 %
Patient temperature: 98.2
Potassium: 4 mmol/L (ref 3.5–5.1)
Sodium: 147 mmol/L — ABNORMAL HIGH (ref 135–145)
TCO2: 22 mmol/L (ref 22–32)
pCO2 arterial: 35.5 mmHg (ref 32.0–48.0)
pH, Arterial: 7.377 (ref 7.350–7.450)
pO2, Arterial: 67 mmHg — ABNORMAL LOW (ref 83.0–108.0)

## 2021-01-10 LAB — CULTURE, RESPIRATORY W GRAM STAIN

## 2021-01-10 LAB — CULTURE, BLOOD (ROUTINE X 2): Special Requests: ADEQUATE

## 2021-01-10 LAB — GLUCOSE, CAPILLARY
Glucose-Capillary: 228 mg/dL — ABNORMAL HIGH (ref 70–99)
Glucose-Capillary: 170 mg/dL — ABNORMAL HIGH (ref 70–99)
Glucose-Capillary: 192 mg/dL — ABNORMAL HIGH (ref 70–99)
Glucose-Capillary: 122 mg/dL — ABNORMAL HIGH (ref 70–99)
Glucose-Capillary: 160 mg/dL — ABNORMAL HIGH (ref 70–99)
Glucose-Capillary: 108 mg/dL — ABNORMAL HIGH (ref 70–99)

## 2021-01-10 MED ORDER — HYDRALAZINE HCL 20 MG/ML IJ SOLN
5.0000 mg | INTRAMUSCULAR | Status: DC | PRN
  Administered 2021-01-10: 20 mg via INTRAVENOUS
  Administered 2021-01-11: 10 mg via INTRAVENOUS
  Administered 2021-01-11: 20 mg via INTRAVENOUS
  Filled 2021-01-10 (×4): qty 1

## 2021-01-10 MED ORDER — FREE WATER
200.0000 mL | Freq: Four times a day (QID) | Status: DC
  Administered 2021-01-10 – 2021-01-11 (×5): 200 mL

## 2021-01-10 MED ORDER — SODIUM CHLORIDE 0.9 % IV SOLN
1.0000 g | Freq: Three times a day (TID) | INTRAVENOUS | Status: DC
  Administered 2021-01-10: 1 g via INTRAVENOUS
  Filled 2021-01-10 (×3): qty 1

## 2021-01-10 MED ORDER — BETHANECHOL CHLORIDE 10 MG PO TABS: 5.0000 mg | ORAL_TABLET | Freq: Three times a day (TID) | ORAL | Status: DC

## 2021-01-10 MED ORDER — DOCUSATE SODIUM 50 MG/5ML PO LIQD: 50.0000 mg | Freq: Two times a day (BID) | ORAL | Status: DC | PRN

## 2021-01-10 MED ORDER — SODIUM CHLORIDE 0.9 % IV SOLN
2.0000 g | INTRAVENOUS | Status: AC
  Administered 2021-01-10 – 2021-01-14 (×5): 2 g via INTRAVENOUS
  Filled 2021-01-10 (×2): qty 2
  Filled 2021-01-10 (×2): qty 20
  Filled 2021-01-10: qty 2

## 2021-01-10 MED ORDER — BETHANECHOL CHLORIDE 10 MG PO TABS
5.0000 mg | ORAL_TABLET | Freq: Three times a day (TID) | ORAL | Status: DC
  Administered 2021-01-10 – 2021-01-16 (×18): 5 mg
  Filled 2021-01-10 (×18): qty 1

## 2021-01-10 NOTE — Progress Notes (Signed)
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   Significant Hospital Events: Including procedures, antibiotic start and stop dates in addition to other pertinent events    On 5/17 presenting with headache, altered mental status. STAT CT head showed diffuse SAH w/ associated hydrocephalus.  He was seen by neuro surgical team w/ plan to proceed w/ ventriculostomy drain prior to surgical intervention. PCCM asked to assist w/care.  Started on unasyn for suspected aspiration.  Cultures sent.  R subclavian CVL and right aline  5/18-> Patient had extensor posturing last night, EVD was lowered from 15-10 and this morning it was lowered to 0, CSF is trending.  CT head was done last night which shows increasing hydrocephalus, despite EVD remain in good position and draining well.   TCD neg.  EEG neg  5/19 -> better neuro exam following commands L> R but still having abnormal extensor movements as well, EVD at 0, TCDs/ EEG neg, afebrile, remains on cleviprex, failed SBT due to bradypnea, not requiring sedation, went for successful pipeline shield embolization of RVA aneurysm   5/20 -> S/p successful pipeline shield embolization of RVA aneurysm 5/19 afternoon w/NSGY.  Fentanyl gtt added overnight by EMD for agitation, vent dyssynchrony and reaching for ETT both w/ UEs, currently at 50 mcg/hr.   Remains on cleviprex gtt at 14 mg/ hr, lisinopril added.  Following specific commands today, and no more posturing noted-> had epidsode of decreased responsive, CTH repeated, stable, expected  changes.  Vomited x 1- sats low 90s.  EVD remains 0, slightly sluggish overnight but now draining well, 118 ml/ 24hrs.  Afebrile, UOP 1.3L/ 24 hrs, trial of foley removal, +382 ml/ net - 714  Interim History / Subjective:   5/23: remains intubated and sedated. Worsening oxygen requirement overnight. tmax 101.9 Needs prn bowel regimen. Remains on agents for bp and vasospasm, tcd's pending.  Awaiting sensitivities of kleb. Will broaden to merrem at this time in light of worsening resp status.  Objective   Blood pressure (!) 152/69, pulse 68, temperature 98.2 F (36.8 C), temperature source Oral, resp. rate (!) 22, weight 95 kg, SpO2 93 %.    Vent Mode: PRVC FiO2 (%):  [60 %-100 %] 100 % Set Rate:  [16 bmp] 16 bmp Vt Set:  [610 mL] 610 mL PEEP:  [5 cmH20-10 cmH20] 10 cmH20 Plateau Pressure:  [15 cmH20-24 cmH20] 15 cmH20   Intake/Output Summary (Last 24 hours) at 01/10/2021 1884 Last data filed at 01/10/2021 0600 Gross per 24 hour  Intake 2889.25 ml  Output 1726 ml  Net 1163.25 ml   Filed Weights   01/08/21 0445 01/09/21 0400 01/10/21 0500  Weight: 93.6 kg 94.5 kg 95 kg    Examination: General:  Critically ill adult male lying in bed in NAD HEENT: MM pink/moist, ETT/ cortrak, pupils 2/sluggish, some scleral edema, right frontal EVD at 0 Neuro: opens eyes to voice, f/c L > R(no movement to gravity) CV: NSR, no murmur PULM:  Non labored currently, some dyssynchrony at times- due to agitation GI: soft, bs hyper active, NT, foley  Extremities: warm/dry, generalized +1 edema  Skin: no rashes    Labs/imaging that I havepersonally reviewed  (right click and "Reselect all SmartList Selections" daily)  5/20 CTH - stable SAH/ EVD, moderate IVH, new 2cm hypodensity in right cerebellar hemisphere- expected with pipeline embolization  5/20 TCD > neg  5/17 BCx 2 >> 1/3 GPC- likely contaminate 5/21 trach asp >>  GNR >>  CBC- improving WBC BMET- sCr 1.42-> 1.36, Cl 114, Na 142 Glucose  trend remains > 200  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 Hypertension  S/p  pipeline shield embolization of RVA aneurysm 5/19 - per NSGY  - continue SBP goals of < 160 - ongoing cleviprex, at 20 mg/hr today although mostly requiring 2mg /hr overnight, but up with family at bedside/ stimulation during the day  - labetalol 150mg  TID with prn labetalol  - avoid apresoline with SAH - continue EVD, continued at 0 - nimodipine 60mg   q 4hrs - ASA/ plavix started 5/20 2/2 pipeline shield embolization - ongoing keppra for seizure ppx - trending TCDs (MWF) -adding bowel regimen - Seizure precautions  - further imaging per NSGY   - keep CVL and aline for now  - PT/ OT - guarded prognosis    Acute hypoxic respiratory failure  Klebsiella pna Repeated aspiration event 5/20 with vomiting episode - Continue MV support, 4-8cc/kg IBW with goal Pplat <30 and DP<15  - VAP prevention protocol/ PPI - PAD protocol for sedation> continue low dose fentanyl for comfort. RASS goal 0/-1 -  FiO2 as able for SpO2 >92%  -  Not ready for SBT given increased ongoing increased FiO2 needs/ vent dyssynchrony- likely due to agitation.  Hold off adding enteral at this time, to avoid ileus, continue with short acting low dose fentanyl - intermittent CXR: worsening L infiltrate on my review 5/23   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 Demand cardiac ischemia TTE 5/18, EF 60-65%, normal RV, indeterminate diastolic, ? small regional wall motion abnormalities (mid anteroseptum).  Trop hs trend 251- 702- 825.  EKG non acute.  amio gtt stopped 5/18.  Consider cardiology consult depending on neuro recovery  - remains NSR.  Continue tele - Mag goal > 2, K > 4  AKI - initially due to dehydration, resolved; increased  sCr 5/21 after foley removed; foley replaced 5/21 - improving renal function and good UOP - trend renal indices/ strict I/Os - Replace electrolytes as indicated - Avoid nephrotoxic agents, ensure adequate renal perfusion\  Hypernatremia Hyperchloremic  -add free water - trend BMET  Diabetes, new diagnosis - A1c 6.5 - still above > 200 - continue SSI moderate - increase TF coverage 5-> 7 units q 4hr - increase levemir 5-> 10 units BID  Leukocytosis- from suspected aspiration, improving - ongoing fever likely neurogenic 5/22 - ongoing tylenol and prn cooling blanket for fever - follow cultures- BC 1/3 w/GPC, suspected contamination - follow trach asp  - continue unasyn for 7 days  Normocytic anemia - H/H stable - trend CBC, transfuse for < 7  Best practice (right click and "Reselect all SmartList Selections" daily)  Diet:  NPO,  TF 5/19, cortrak placed 5/20 Pain/Anxiety/Delirium protocol (if indicated): Yes (RASS goal 0) VAP protocol (if indicated): Yes DVT prophylaxis: SCD only  GI prophylaxis: PPI Glucose control:  SSI Yes Central venous access:  Yes,  and it is still needed Arterial line:  N/A Foley:  Yes, and it is still needed  Mobility:  bed rest  PT consulted: N/A Last date of multidisciplinary goals of care discussion [Per primary team] Code Status:  full code Disposition: ICU   Wife and mother updated at bedside 5/22      Critical care time: The patient is critically ill with multiple organ systems failure and requires high complexity decision making for assessment and support, frequent evaluation and titration of therapies, application of advanced monitoring technologies and extensive interpretation of multiple databases.  Critical care time . This represents my time independent of the NPs time taking care of the pt. This is  excluding procedures.    Briant Sites DO [facility] 01/10/2021, 8:14 AM See Amion for pager If  no response to pager, please call [phone] until 1900 After 1900 please call [facility] [phone]

## 2021-01-10 NOTE — Progress Notes (Addendum)
Transcranial Doppler  Date POD PCO2 HCT BP  MCA ACA PCA OPHT SIPH VERT Basilar  5/18 [facility]     Right  Left   37  40   -40  -28   28  28   24  22   31  23    -58  -33         5/20 MR     Right  Left   62  75   -34  -78   57  41   28  26   43  53   *  *   *      5/23 RH     Right  Left   90  171   -45  -58   76  38   22  35   42  23   -55  -38   *            Right  Left                                             Right  Left                                            Right  Left                                            Right  Left                                        MCA = Middle Cerebral Artery      OPHT = Opthalmic Artery     BASILAR = Basilar Artery   ACA = Anterior Cerebral Artery     SIPH = Carotid Siphon PCA = Posterior Cerebral Artery   VERT = Verterbral Artery                   Normal MCA = 62+\-12 ACA = 50+\-12 PCA = 42+\-23   RT Lind[place]ard = 3.00 LT Lind[place]ard = 6.58  07-05-1988, RDMS, RVT

## 2021-01-10 NOTE — Progress Notes (Addendum)
eLink Physician-Brief Progress Note Patient Name: [name] DOB: [birth year] MRN: [id number]   Date of Service  01/10/2021  HPI/Events of Note  O2 sats maintaining upper 80's. FiO2 increased from 70 to 100 over night. Lungs sound the same ETT in same position   S/p aspiration on 20th. SAH, s/ emolization. Failing SBT.  Camera: In synchrony with Vent, PEEP of 5. sats 87% on 100% fio2. MAP -on clevirapex gtt.  Discussed with RN. No fever. HR 60's.  No ET secretions. Circuits ok.    eICU Interventions  - Increase PEEP to 10 - get CxR and ABG stat     Intervention Category Intermediate Interventions: Respiratory distress - evaluation and management  Ranee Gosselin 01/10/2021, 5:03 AM   6:30 CxR: bi basal air space. ET in place. P/F ratio 67, but sats on Camera monitor is 93. Continue care.

## 2021-01-10 NOTE — Progress Notes (Incomplete)
{  Select Note:[id number]} 

## 2021-01-10 NOTE — Progress Notes (Signed)
Pharmacy Antibiotic Note  Shawn Lane is a 60 y.o. male admitted on 01/07/2021 with pneumonia.  Pharmacy has been consulted for Meropenem dosing.   Patient growing Klebsiella oxytoca - vent setting increased to FiO2 of 100% overnight and CXR worsening.  Will change from Unasyn to Meropenem until susceptibilities come back given worsening clinical status.  Plan: Meropenem 1 gm IV q8hr Monitor renal function, C&S and clinical status  Weight: 95 kg (209 lb 7 oz)  Temp (24hrs), Avg:99 F (37.2 C), Min:97.9 F (36.6 C), Max:100.8 F (38.2 C)  Recent Labs  Lab 01/02/2021 1606 12/28/2020 1637 01/05/21 0427 Jan 31, 2021 0827 01/07/21 0500 01/08/21 0457 01/09/21 0426  WBC 24.6*   < > 13.3* 11.8* 12.2* 16.8* 10.4  CREATININE 1.64*  --  1.02 1.16 1.16 1.42* 1.36*  LATICACIDVEN 4.9*  --  1.3  --   --   --   --    < > = values in this interval not displayed.    Estimated Creatinine Clearance: 66.8 mL/min (A) (by C-G formula based on SCr of 1.36 mg/dL (H)).    No Known Allergies  Antimicrobials this admission: Merrem 5/23 >>   Microbiology results: 5/17  BCx: GPR 5/21 Sputum: Klebsiella oxytoca 5/18 MRSA PCR: negative  Thank you for allowing pharmacy to be a part of this patient's care.  Jeanella Cara, PharmD, [facility] Clinical Pharmacist Please see AMION for all Pharmacists' Contact Phone Numbers 01/10/2021, 9:05 AM

## 2021-01-10 NOTE — Progress Notes (Signed)
  NEUROSURGERY PROGRESS NOTE   Pt had oxygenation issue last pm, PEEP increased.  EXAM:  BP (!) 172/74   Pulse 75   Temp 99.7 F (37.6 C) (Axillary)   Resp 20   Wt 95 kg   SpO2 98%   BMI 28.80 kg/m   Opens eyes to voice Pupils reactive Breathing over vent Follows commands LUE/BLE Minimal non-purposeful movements RUE  TCD: 5/23 RH     Right  Left   90  171   -45  -58   76  38   22  35   42  23   -55  -38   *        IMPRESSION:  60 y.o. male SAH d# 6 s/p Pipeline embolization RVA aneurysm. 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 - VDRF - Communicating HCP  PLAN: - Will increase SBP goal up to , vasopressor support as needed - Increase IVF to 125cc/hr - Cont Nimotop, Keppra - Cont EVD drainage open at 0.   [name], MD [facility]

## 2021-01-11 ENCOUNTER — Inpatient Hospital Stay (HOSPITAL_COMMUNITY): Payer: 59

## 2021-01-11 DIAGNOSIS — I609 Nontraumatic subarachnoid hemorrhage, unspecified: Secondary | ICD-10-CM | POA: Diagnosis not present

## 2021-01-11 LAB — GLUCOSE, CAPILLARY
Glucose-Capillary: 135 mg/dL — ABNORMAL HIGH (ref 70–99)
Glucose-Capillary: 146 mg/dL — ABNORMAL HIGH (ref 70–99)
Glucose-Capillary: 147 mg/dL — ABNORMAL HIGH (ref 70–99)
Glucose-Capillary: 168 mg/dL — ABNORMAL HIGH (ref 70–99)
Glucose-Capillary: 171 mg/dL — ABNORMAL HIGH (ref 70–99)
Glucose-Capillary: 94 mg/dL (ref 70–99)

## 2021-01-11 MED ORDER — PHENYLEPHRINE HCL-NACL 10-0.9 MG/250ML-% IV SOLN
0.0000 ug/min | INTRAVENOUS | Status: DC
  Administered 2021-01-11: 20 ug/min via INTRAVENOUS
  Filled 2021-01-11 (×2): qty 250

## 2021-01-11 MED ORDER — NOREPINEPHRINE 4 MG/250ML-% IV SOLN
0.0000 ug/min | INTRAVENOUS | Status: DC
  Administered 2021-01-11: 5 ug/min via INTRAVENOUS
  Filled 2021-01-11: qty 250

## 2021-01-11 MED ORDER — SODIUM CHLORIDE 0.9 % IV SOLN: INTRAVENOUS | Status: DC

## 2021-01-11 NOTE — Progress Notes (Signed)
Patient vomited x2, PRN zofran given, tube feeding stopped. E Link notified. Will restart tube feed in the morning.

## 2021-01-11 NOTE — Progress Notes (Signed)
eLink Physician-Brief Progress Note Patient Name: Shawn Lane DOB: July 22, 1961 MRN: [id number]   Date of Service  01/11/2021  HPI/Events of Note  Patient vomiting - Tube feeds held. Blood glucose = 171. Patient is on Levemir + Q 4 hour moderate Novolog SSI + Q 4 hour 7 units of Novolog tube feed coverage. He has already received his 10 PM Levemir.   eICU Interventions  Plan: 1. Portable Abdominal film STAT. 2. Do not give Q 4 hour additional 7 units of tube feed coverage. 3. Follow closely, may require D10W IV infusion and decrease in Levemir dose.      Intervention Category Major Interventions: Other:  Lenell Antu 01/11/2021, 10:50 PM

## 2021-01-11 NOTE — Progress Notes (Signed)
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   Significant Hospital Events: Including procedures, antibiotic start and stop dates in addition to other pertinent events    On 5/17 presenting with headache, altered mental status. STAT CT head showed diffuse SAH w/ associated hydrocephalus.  He was seen by neuro surgical team w/ plan to proceed w/ ventriculostomy drain prior to surgical intervention. PCCM asked to assist w/care.  Started on unasyn for suspected aspiration.  Cultures sent.  R subclavian CVL and right aline  5/18-> Patient had extensor posturing last night, EVD was lowered from 15-10 and this morning it was lowered to 0, CSF is trending.  CT head was done last night which shows increasing hydrocephalus, despite EVD remain in good position and draining well.   TCD neg.  EEG neg  5/19 -> better neuro exam following commands L> R but still having abnormal extensor movements as well, EVD at 0, TCDs/ EEG neg, afebrile, remains on cleviprex, failed SBT due to bradypnea, not requiring sedation, went for successful pipeline shield embolization of RVA aneurysm   5/20 -> S/p successful pipeline shield embolization of RVA aneurysm 5/19 afternoon w/NSGY.  Fentanyl gtt added overnight by EMD for agitation, vent dyssynchrony and reaching for ETT both w/ UEs, currently at 50 mcg/hr.   Remains on cleviprex gtt at 14 mg/ hr, lisinopril added.  Following specific commands today, and no more posturing noted-> had epidsode of decreased responsive, CTH repeated, stable, expected  changes.  Vomited x 1- sats low 90s.  EVD remains 0, slightly sluggish overnight but now draining well, 118 ml/ 24hrs.  Afebrile, UOP 1.3L/ 24 hrs, trial of foley removal, +382 ml/ net - 714  5/23: Klebsiella sensitivities obtained; Meropenem changed to Ceftriaxone 2 g  IV 200 ml/hr -IVF 125 ml/hr  Interim History / Subjective:   5/24: remains intubated and sedated. Improving oxygen requirement overnight; Fio2 of 50% and O2 of 94. tmax 100.1.  Per NSGY, SBP goal up to , vasopressor support as needed.    Objective   Blood pressure (!) 199/84, pulse 84, temperature 100.1 F (37.8 C), temperature source Oral, resp. rate (!) 23, weight 96.1 kg, SpO2 94 %.    Vent Mode: PRVC FiO2 (%):  [50 %-100 %] 50 % Set Rate:  [16 bmp] 16 bmp Vt Set:  [610 mL] 610 mL PEEP:  [10 cmH20] 10 cmH20 Plateau Pressure:  [22 cmH20-24 cmH20] 22 cmH20   Intake/Output Summary (Last 24 hours) at 01/11/2021 3382 Last data filed at 01/11/2021 0800 Gross per 24 hour  Intake 2511.43 ml  Output 2797 ml  Net -285.57 ml   Filed Weights   01/09/21 0400 01/10/21 0500 01/11/21 0412  Weight: 94.5 kg 95 kg 96.1 kg    Examination: General:  Critically ill adult male lying in bed in NAD HEENT: MM pink/moist, ETT/ cortrak, pupils 2/sluggish, some scleral edema, right frontal EVD at 0 Neuro: opens eyes to voice, f/c L > R(no movement to gravity) CV: NSR, no murmur PULM:  Non labored currently, some dyssynchrony at times- due to agitation GI:  soft, bs hyper active, NT, foley  Extremities: warm/dry, generalized +1 edema  Skin: no rashes    Labs/imaging that I havepersonally reviewed  (right click and "Reselect all SmartList Selections" daily)  5/23 CXR - Low lung volumes with slightly worsened bilateral lower lobe to mid lung zone patchy airspace opacities.  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 Hypertension  S/p  pipeline shield embolization of RVA aneurysm 5/19 - per NSGY  - increased SBP goals of ~/> 200 -Phenylephrine titrate to above goal  - Increased IVF to 125cc/hr - Cont nimodipine 60mg  q 4hrs - cont keppra for seizure ppx - Cont EVD drainage open at 0: per neurosx - D/c cleviprex, labetalol -at this time benefit of asa/plavix outweigh risk with decreased hgb - avoid apresoline with SAH - holding bowel regimen  - further imaging per NSGY    - keep CVL and aline for now  - PT/ OT when able - guarded prognosis   -ordered CMP, CBC, CXR for 5/25  Acute hypoxic respiratory failure  Klebsiella pna Repeated aspiration event 5/20 with vomiting episode - Continue MV support, 4-8cc/kg IBW with goal Pplat <30 and DP<15  - VAP prevention protocol/ PPI - PAD protocol for sedation> continue low dose fentanyl for comfort. RASS goal 0/-1 -  FiO2 as able for SpO2 >92%  -  Not ready for SBT given increased ongoing increased FiO2 needs/ vent dyssynchrony- likely due to agitation.  Hold off adding enteral at this time, to avoid ileus, continue with short acting low dose fentanyl - improving FiO2 of 50; O2 of 94%.    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 Demand cardiac ischemia TTE 5/18, EF 60-65%, normal RV, indeterminate diastolic, ? small regional wall motion abnormalities (mid anteroseptum).  Trop hs trend 251- 702- 825.  EKG non acute.  amio gtt stopped 5/18.  Consider cardiology consult depending on neuro recovery  - remains NSR.  Continue tele - Mag goal > 2, K >  4  AKI - initially due to dehydration, resolved; increased sCr 5/21 after foley removed; foley replaced 5/21 - improving renal function and good UOP - trend renal indices/ strict I/Os - Replace electrolytes as indicated - Avoid nephrotoxic agents, ensure adequate renal perfusion -adding retention agents   Hypernatremia Hyperchloremic  - free water ongoing  - trend BMET in am   Diabetes, new diagnosis - A1c 6.5 - currently below 200  - continue SSI moderate - continue TF 7 units q 4hr - continue levemir 10 units BID  Leukocytosis-Kleb pneumonia related to aspiration, improving - ongoing fever likely neurogenic 5/22 - ongoing tylenol and prn cooling blanket for fever - follow cultures- BC 1/3 w/GPC, suspected contamination   Normocytic anemia - H/H stable - trend CBC, transfuse for < 7   Best practice (right click and "Reselect all SmartList Selections" daily)  Diet:  NPO,  TF 5/19, cortrak placed 5/20 Pain/Anxiety/Delirium protocol (if indicated): Yes (RASS goal 0) VAP protocol (if indicated): Yes DVT prophylaxis:  SCD only  GI prophylaxis: PPI Glucose control:  SSI Yes Central venous access:  Yes, and it is still needed Arterial line:  Yes, and it is still needed Foley:  Yes, and it is still needed  Mobility:  bed rest  PT consulted: N/A Last date of multidisciplinary goals of care discussion [Per primary team] Code Status:  full code Disposition: ICU         Doran Clay MS4 Critical care time: The patient is critically ill with multiple organ systems failure and requires high complexity decision making for assessment and support, frequent evaluation and titration of therapies, application of advanced monitoring technologies and extensive interpretation of multiple databases.  Critical care time 35 mins. This represents my time independent of the NPs time taking care of the pt. This is excluding procedures.    Briant Sites DO [facility] 01/11/2021, 3:04 PM See Amion for pager If no response to pager, please call [phone] until 1900 After 1900 please call [facility] [phone]

## 2021-01-12 ENCOUNTER — Inpatient Hospital Stay (HOSPITAL_COMMUNITY): Payer: 59

## 2021-01-12 DIAGNOSIS — I609 Nontraumatic subarachnoid hemorrhage, unspecified: Secondary | ICD-10-CM

## 2021-01-12 LAB — GLUCOSE, CAPILLARY
Glucose-Capillary: 108 mg/dL — ABNORMAL HIGH (ref 70–99)
Glucose-Capillary: 114 mg/dL — ABNORMAL HIGH (ref 70–99)
Glucose-Capillary: 125 mg/dL — ABNORMAL HIGH (ref 70–99)
Glucose-Capillary: 145 mg/dL — ABNORMAL HIGH (ref 70–99)
Glucose-Capillary: 170 mg/dL — ABNORMAL HIGH (ref 70–99)
Glucose-Capillary: 192 mg/dL — ABNORMAL HIGH (ref 70–99)

## 2021-01-12 LAB — CBC WITH DIFFERENTIAL/PLATELET
Abs Immature Granulocytes: 0.23 10*3/uL — ABNORMAL HIGH (ref 0.00–0.07)
Basophils Absolute: 0 10*3/uL (ref 0.0–0.1)
Basophils Relative: 0 %
Eosinophils Absolute: 0.1 10*3/uL (ref 0.0–0.5)
Eosinophils Relative: 1 %
HCT: 29.4 % — ABNORMAL LOW (ref 39.0–52.0)
Hemoglobin: 8.9 g/dL — ABNORMAL LOW (ref 13.0–17.0)
Immature Granulocytes: 2 %
Lymphocytes Relative: 16 %
Lymphs Abs: 1.6 10*3/uL (ref 0.7–4.0)
MCH: 28.8 pg (ref 26.0–34.0)
MCHC: 30.3 g/dL (ref 30.0–36.0)
MCV: 95.1 fL (ref 80.0–100.0)
Monocytes Absolute: 0.9 10*3/uL (ref 0.1–1.0)
Monocytes Relative: 9 %
Neutro Abs: 7.3 10*3/uL (ref 1.7–7.7)
Neutrophils Relative %: 72 %
Platelets: 221 10*3/uL (ref 150–400)
RBC: 3.09 MIL/uL — ABNORMAL LOW (ref 4.22–5.81)
RDW: 13.4 % (ref 11.5–15.5)
WBC: 10.2 10*3/uL (ref 4.0–10.5)
nRBC: 0.4 % — ABNORMAL HIGH (ref 0.0–0.2)

## 2021-01-12 LAB — POCT I-STAT 7, (LYTES, BLD GAS, ICA,H+H)
Acid-Base Excess: 3 mmol/L — ABNORMAL HIGH (ref 0.0–2.0)
Bicarbonate: 27.2 mmol/L (ref 20.0–28.0)
Calcium, Ion: 1.18 mmol/L (ref 1.15–1.40)
HCT: 29 % — ABNORMAL LOW (ref 39.0–52.0)
Hemoglobin: 9.9 g/dL — ABNORMAL LOW (ref 13.0–17.0)
O2 Saturation: 90 %
Patient temperature: 98.4
Potassium: 3.5 mmol/L (ref 3.5–5.1)
Sodium: 152 mmol/L — ABNORMAL HIGH (ref 135–145)
TCO2: 28 mmol/L (ref 22–32)
pCO2 arterial: 41.4 mmHg (ref 32.0–48.0)
pH, Arterial: 7.425 (ref 7.350–7.450)
pO2, Arterial: 58 mmHg — ABNORMAL LOW (ref 83.0–108.0)

## 2021-01-12 LAB — CBC
HCT: 30 % — ABNORMAL LOW (ref 39.0–52.0)
Hemoglobin: 9.3 g/dL — ABNORMAL LOW (ref 13.0–17.0)
MCH: 29.2 pg (ref 26.0–34.0)
MCHC: 31 g/dL (ref 30.0–36.0)
MCV: 94 fL (ref 80.0–100.0)
Platelets: 234 10*3/uL (ref 150–400)
RBC: 3.19 MIL/uL — ABNORMAL LOW (ref 4.22–5.81)
RDW: 13.4 % (ref 11.5–15.5)
WBC: 10.1 10*3/uL (ref 4.0–10.5)
nRBC: 0.3 % — ABNORMAL HIGH (ref 0.0–0.2)

## 2021-01-12 LAB — COMPREHENSIVE METABOLIC PANEL
ALT: 32 U/L (ref 0–44)
AST: 22 U/L (ref 15–41)
Albumin: 2.1 g/dL — ABNORMAL LOW (ref 3.5–5.0)
Alkaline Phosphatase: 54 U/L (ref 38–126)
Anion gap: 7 (ref 5–15)
BUN: 35 mg/dL — ABNORMAL HIGH (ref 8–23)
CO2: 27 mmol/L (ref 22–32)
Calcium: 8.4 mg/dL — ABNORMAL LOW (ref 8.9–10.3)
Chloride: 116 mmol/L — ABNORMAL HIGH (ref 98–111)
Creatinine, Ser: 1.07 mg/dL (ref 0.61–1.24)
GFR, Estimated: >60 mL/min (ref >60)
Glucose, Bld: 132 mg/dL — ABNORMAL HIGH (ref 70–99)
Potassium: 3.8 mmol/L (ref 3.5–5.1)
Sodium: 150 mmol/L — ABNORMAL HIGH (ref 135–145)
Total Bilirubin: 0.5 mg/dL (ref 0.3–1.2)
Total Protein: 5.3 g/dL — ABNORMAL LOW (ref 6.5–8.1)

## 2021-01-12 MED ORDER — SODIUM CHLORIDE 0.9 % IV SOLN
25.0000 mg | Freq: Once | INTRAVENOUS | Status: AC
  Administered 2021-01-12: 25 mg via INTRAVENOUS
  Filled 2021-01-12: qty 1

## 2021-01-12 MED ORDER — FREE WATER
200.0000 mL | Status: DC
  Administered 2021-01-12 – 2021-01-13 (×14): 200 mL

## 2021-01-12 MED ORDER — HYDRALAZINE HCL 20 MG/ML IJ SOLN
5.0000 mg | INTRAMUSCULAR | Status: DC | PRN
  Administered 2021-01-12: 5 mg via INTRAVENOUS
  Filled 2021-01-12: qty 1

## 2021-01-12 MED ORDER — MIDAZOLAM HCL 2 MG/2ML IJ SOLN
2.0000 mg | Freq: Once | INTRAMUSCULAR | Status: DC
  Filled 2021-01-12: qty 2

## 2021-01-12 MED ORDER — HYDRALAZINE HCL 20 MG/ML IJ SOLN
5.0000 mg | INTRAMUSCULAR | Status: DC | PRN
  Administered 2021-01-12: 5 mg via INTRAVENOUS
  Administered 2021-01-13: 20 mg via INTRAVENOUS
  Administered 2021-01-13: 5 mg via INTRAVENOUS
  Filled 2021-01-12 (×4): qty 1

## 2021-01-12 NOTE — Progress Notes (Signed)
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   Significant Hospital Events: Including procedures, antibiotic start and stop dates in addition to other pertinent events    On 5/17 presenting with headache, altered mental status. STAT CT head showed diffuse SAH w/ associated hydrocephalus.  He was seen by neuro surgical team w/ plan to proceed w/ ventriculostomy drain prior to surgical intervention. PCCM asked to assist w/care.  Started on unasyn for suspected aspiration.  Cultures sent.  R subclavian CVL and right aline  5/18-> Patient had extensor posturing last night, EVD was lowered from 15-10 and this morning it was lowered to 0, CSF is trending.  CT head was done last night which shows increasing hydrocephalus, despite EVD remain in good position and draining well.   TCD neg.  EEG neg  5/19 -> better neuro exam following commands L> R but still having abnormal extensor movements as well, EVD at 0, TCDs/ EEG neg, afebrile, remains on cleviprex, failed SBT due to bradypnea, not requiring sedation, went for successful pipeline shield embolization of RVA aneurysm   5/20 -> S/p successful pipeline shield embolization of RVA aneurysm 5/19 afternoon w/NSGY.  Fentanyl gtt added overnight by EMD for agitation, vent dyssynchrony and reaching for ETT both w/ UEs, currently at 50 mcg/hr.   Remains on cleviprex gtt at 14 mg/ hr, lisinopril added.  Following specific commands today, and no more posturing noted-> had epidsode of decreased responsive, CTH repeated, stable, expected  changes.  Vomited x 1- sats low 90s.  EVD remains 0, slightly sluggish overnight but now draining well, 118 ml/ 24hrs.  Afebrile, UOP 1.3L/ 24 hrs, trial of foley removal, +382 ml/ net - 714  5/23: Klebsiella sensitivities obtained; Meropenem changed to Ceftriaxone 2 g  IV 200 ml/hr. IVF 125 ml/hr  5/24 : Patient vomited x2, PRN zofran given. Patient with hiccups and not getting volumes on ventilator according to bedside nurse and RT. Ventilator switch out.  Thorazine 25 mg IV X 1 to improve hiccups.   Interim History / Subjective:   5/25: remains intubated and sedated. Overnight, Pt desatting to 87. Pt was dyssynchronous with the vent and not receiving good tidal volumes.  Ventilator switch out. Pt tolerating well; FiO2 of 40 and O2 Stat of 98. tmax 103.1.  CXR - Endotracheal tube slightly withdrawn at the level of the clavicular Heads. Improved pulmonary insufflation.  Sodium - 152; Hypernatremia - free water deficit of 4.1 Liters     Objective   Blood pressure (!) 224/83, pulse 89, temperature 98.4 F (36.9 C), temperature source Axillary, resp. rate 16, weight 96.1 kg, SpO2 94 %.    Vent Mode: PRVC FiO2 (%):  [40 %-60 %] 40 % Set Rate:  [16 bmp] 16 bmp Vt Set:  [610 mL] 610 mL PEEP:  [8 cmH20-10 cmH20] 8 cmH20 Plateau Pressure:  [18 cmH20-24 cmH20] 24 cmH20   Intake/Output Summary (Last 24 hours) at 01/12/2021 0821 Last data filed at 01/12/2021 0700 Gross per 24 hour  Intake 4358.66 ml  Output 3985 ml  Net 373.66 ml  Filed Weights   01/09/21 0400 01/10/21 0500 01/11/21 0412  Weight: 94.5 kg 95 kg 96.1 kg    Examination: General:  Critically ill adult male lying in bed in NAD, intermittent hiccups HEENT: MM pink/moist, ETT/ cortrak, pupils 2/sluggish, some scleral edema, right frontal EVD at 0 Neuro: opens eyes to voice, f/c L > R(no movement to gravity) CV: NSR, no murmur PULM:  Non labored currently, some dyssynchrony at times- due to agitation and hiccups  GI: soft,  bs hyper active, NT, foley  Extremities: warm/dry, generalized +1 edema  Skin: no rashes    Labs/imaging that I havepersonally reviewed  (right click and "Reselect all SmartList Selections" daily)  5/25: CXR - Endotracheal tube slightly withdrawn at the level of the clavicular Heads. Improved pulmonary insufflation.  5/23 CXR - Low lung volumes with slightly worsened bilateral lower lobe to mid lung zone patchy airspace opacities.  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 Hypertension  S/p  pipeline shield embolization of RVA aneurysm 5/19 - per NSGY  - increased SBP goals of 180-220 per neurosx -on norepi   - Cont nimodipine 60mg  q 4hrs - cont keppra for seizure ppx - Cont EVD drainage open at 0: per neurosx -at this time benefit of asa/plavix outweigh risk with decreased hgb - avoid apresoline with SAH - holding bowel regimen  - further imaging per NSGY    - keep CVL and aline for now  - PT/ OT when able  - guarded prognosis    Acute hypoxic respiratory failure  Klebsiella pna Repeated aspiration event 5/20 with vomiting episode - Continue MV support, 4-8cc/kg IBW with goal Pplat <30 and DP<15  - VAP prevention protocol/ PPI - PAD protocol for sedation> continue low dose fentanyl for comfort. RASS goal 0/-1 -  FiO2 as able for SpO2 >92%  -  Not ready for SBT given increased ongoing increased FiO2 needs/ vent dyssynchrony- likely due to agitation.  Hold off adding enteral at this time, to avoid ileus, continue with short acting low dose fentanyl - continue  Ceftriaxone 2 g  IV 200 ml/hr.  3/5 days   - improving FiO2 of 40; O2 of 98%. improved pulmonary insufflation on CXR - Endotracheal tube slightly withdrawn at the level of the clavicular Heads; should be advanced.   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 Demand cardiac ischemia TTE 5/18, EF 60-65%, normal RV, indeterminate diastolic, ? small regional wall motion abnormalities (mid anteroseptum).  Trop hs trend 251- 702- 825.  EKG non acute.  amio gtt stopped 5/18.  Consider cardiology consult depending on neuro recovery  - remains NSR.  Continue tele - Mag goal > 2, K > 4  AKI - initially due to dehydration, resolved; increased sCr 5/21 after foley removed; foley replaced 5/21. Foley removed 5/25  - improving renal function and good UOP - trend renal indices/ strict I/Os - Replace electrolytes as indicated - Avoid nephrotoxic agents, ensure adequate renal perfusion - Continue retention agents - Cr 1.07 on 5/25; improved from 1.36 5/22    Hypernatremia Hyperchloremic  - 5/25  Na 152; FWD 4.1 L  -free water ongoing, Now 200 ml q2hr - trend BMET in am - bmp for 5/26 ordered   Diabetes, new diagnosis - A1c 6.5 - currently below 200  - continue SSI moderate - continue TF 7 units q 4hr - continue levemir 10 units BID  Leukocytosis-Kleb pneumonia related to aspiration, improved, 5/25 WBC 10.1  - ongoing fever likely neurogenic 5/22 - ongoing tylenol and prn cooling blanket for fever - follow cultures- BC 1/3 w/GPC, suspected contamination   Normocytic anemia - H/H stable - trend CBC, transfuse for < 7 - Hb 9.9 5/25   Best practice (right click and "Reselect all SmartList Selections" daily)  Diet:  NPO,  TF 5/19, cortrak placed 5/20 Pain/Anxiety/Delirium protocol (if indicated): Yes (RASS goal 0) VAP protocol (if indicated): Yes DVT prophylaxis: SCD only  GI prophylaxis: PPI Glucose control:  SSI Yes Central venous access:  Yes, and it is still  needed Arterial line:  Yes, and it is still needed Foley:  Yes, and it is still needed  Mobility:  bed rest  PT consulted: N/A Last date of multidisciplinary goals of care discussion [Per primary team] Code Status:  full code Disposition: ICU       Doran Clay MS4  Critical care time: The patient is critically ill with multiple organ systems failure and requires high complexity decision making for assessment and support, frequent evaluation and titration of therapies, application of advanced monitoring technologies and extensive interpretation of multiple databases.  Critical care time 35 mins. This represents my time independent of the NPs time taking care of the pt. This is excluding procedures.    Briant Sites DO [facility] 01/12/2021, 8:21 AM See Amion for pager If no response to pager, please call [phone] until 1900 After 1900 please call [facility] [phone]

## 2021-01-12 NOTE — Progress Notes (Signed)
eLink Physician-Brief Progress Note Patient Name: Shawn Lane DOB: Feb 02, 1961 MRN: [id number]   Date of Service  01/12/2021  HPI/Events of Note  Patient with hiccups and not getting volumes on ventilator according to bedside nurse and RT. Ventilator switch out to remove ventilator failure from the equation as I have had this ventilator fail in the past with similar presentation, patient not getting their volumes. Will try Thorazine 25 mg IV X 1 to see if we can improve hiccups.  eICU Interventions  Plan: 1. Portable CXR STAT. 2. Thorazine 25 mg IV X 1 now.  3. Will request that PCCM ground team evaluate the patient at bedside.     Intervention Category Major Interventions: Respiratory failure - evaluation and management  Tilla Wilborn Dennard Nip 01/12/2021, 12:59 AM

## 2021-01-12 NOTE — Progress Notes (Signed)
Dr. Conchita Paris wants to keep the pt's SBP between 180-200 by a-line.  Levophed started. A-line in place but positional at times due to pt's movement of his left arm.

## 2021-01-12 NOTE — Progress Notes (Signed)
RT called to pts room for patient desatting into the 80s. Pt was dyssynchronous with the vent and not receiving good tidal volumes. FiO2 was increased at the time. Elink was contacted and Arsenio Loader MD wanted the vent changed out.

## 2021-01-12 NOTE — Progress Notes (Signed)
Pt's SBP in 220s by a line. Gave 5 mg hydralazine. Clarified SBP goals with Sabino Dick, PA.  He stated 180--220 is the goal. Will change PRNs and pressors to reflect this range.

## 2021-01-12 NOTE — Progress Notes (Signed)
Transcranial Doppler  Date POD PCO2 HCT BP  MCA ACA PCA OPHT SIPH VERT Basilar  5/18 [facility]     Right  Left   37  40   -40  -28   28  28   24  22   31  23    -58  -33         5/20 MR     Right  Left   62  75   -34  -78   57  41   28  26   43  53   *  *   *      5/23 RH     Right  Left   90  171   -45  -58   76  38   22  35   42  23   -55  -38   *      5/25 RH     Right  Left   114  130   -30  -36   49  33   16  17   45  28   -47  -29   -95            Right  Left                                            Right  Left                                            Right  Left                                        MCA = Middle Cerebral Artery OPHT = Opthalmic Artery BASILAR = Basilar Artery  ACA = Anterior Cerebral Artery SIPH = Carotid Siphon PCA = Posterior Cerebral Artery VERT = Verterbral Artery   Normal MCA = 62+\-12 ACA = 50+\-12 PCA = 42+\-23   RT Lindegaard = 3.35 LT Lindegaard = 4.82  6/25, RDMS, RVT with Jean Rosenthal, RDMS, RVT

## 2021-01-13 ENCOUNTER — Inpatient Hospital Stay (HOSPITAL_COMMUNITY): Payer: 59

## 2021-01-13 DIAGNOSIS — I609 Nontraumatic subarachnoid hemorrhage, unspecified: Secondary | ICD-10-CM | POA: Diagnosis not present

## 2021-01-13 LAB — BASIC METABOLIC PANEL
Anion gap: 7 (ref 5–15)
BUN: 32 mg/dL — ABNORMAL HIGH (ref 8–23)
CO2: 25 mmol/L (ref 22–32)
Calcium: 8.2 mg/dL — ABNORMAL LOW (ref 8.9–10.3)
Chloride: 112 mmol/L — ABNORMAL HIGH (ref 98–111)
Creatinine, Ser: 1.01 mg/dL (ref 0.61–1.24)
GFR, Estimated: >60 mL/min (ref >60)
Glucose, Bld: 213 mg/dL — ABNORMAL HIGH (ref 70–99)
Potassium: 3.6 mmol/L (ref 3.5–5.1)
Sodium: 144 mmol/L (ref 135–145)

## 2021-01-13 LAB — POCT I-STAT 7, (LYTES, BLD GAS, ICA,H+H)
Acid-Base Excess: 2 mmol/L (ref 0.0–2.0)
Bicarbonate: 27.5 mmol/L (ref 20.0–28.0)
Calcium, Ion: 1.17 mmol/L (ref 1.15–1.40)
HCT: 39 % (ref 39.0–52.0)
Hemoglobin: 13.3 g/dL (ref 13.0–17.0)
O2 Saturation: 94 %
Potassium: 3.7 mmol/L (ref 3.5–5.1)
Sodium: 149 mmol/L — ABNORMAL HIGH (ref 135–145)
TCO2: 29 mmol/L (ref 22–32)
pCO2 arterial: 44.3 mmHg (ref 32.0–48.0)
pH, Arterial: 7.401 (ref 7.350–7.450)
pO2, Arterial: 73 mmHg — ABNORMAL LOW (ref 83.0–108.0)

## 2021-01-13 LAB — GLUCOSE, CAPILLARY
Glucose-Capillary: 103 mg/dL — ABNORMAL HIGH (ref 70–99)
Glucose-Capillary: 109 mg/dL — ABNORMAL HIGH (ref 70–99)
Glucose-Capillary: 151 mg/dL — ABNORMAL HIGH (ref 70–99)
Glucose-Capillary: 167 mg/dL — ABNORMAL HIGH (ref 70–99)
Glucose-Capillary: 170 mg/dL — ABNORMAL HIGH (ref 70–99)
Glucose-Capillary: 95 mg/dL (ref 70–99)

## 2021-01-13 LAB — PROCALCITONIN: Procalcitonin: <0.1 ng/mL

## 2021-01-13 MED ORDER — PROSOURCE TF PO LIQD
45.0000 mL | Freq: Three times a day (TID) | ORAL | Status: DC
  Administered 2021-01-13 – 2021-01-16 (×10): 45 mL
  Filled 2021-01-13 (×10): qty 45

## 2021-01-13 MED ORDER — FREE WATER
150.0000 mL | Status: DC
  Administered 2021-01-13 – 2021-01-14 (×5): 150 mL

## 2021-01-13 MED ORDER — LABETALOL HCL 5 MG/ML IV SOLN
10.0000 mg | INTRAVENOUS | Status: DC | PRN
  Administered 2021-01-13 – 2021-01-14 (×4): 10 mg via INTRAVENOUS
  Filled 2021-01-13 (×4): qty 4

## 2021-01-13 MED ORDER — VITAL 1.5 CAL PO LIQD
1000.0000 mL | ORAL | Status: DC
  Administered 2021-01-13 – 2021-01-15 (×3): 1000 mL
  Filled 2021-01-13 (×2): qty 1000

## 2021-01-13 NOTE — Procedures (Signed)
Arterial Catheter Insertion Procedure Note  DEAUNTE DENTE  [id number]  08-May-1961  Date:01/13/21  Time:6:52 PM    Provider Performing: Briant Sites    Procedure: Insertion of Arterial Line (00762) with US guidance (26333)   Indication(s) Blood pressure monitoring and/or need for frequent ABGs  Consent Unable to obtain consent due to emergent nature of procedure.  Anesthesia minimal   Time Out Verified patient identification, verified procedure, site/side was marked, verified correct patient position, special equipment/implants available, medications/allergies/relevant history reviewed, required imaging and test results available.   Sterile Technique Maximal sterile technique including full sterile barrier drape, hand hygiene, sterile gown, sterile gloves, mask, hair covering, sterile ultrasound probe cover (if used).   Procedure Description Area of catheter insertion was cleaned with chlorhexidine and draped in sterile fashion. With real-time ultrasound guidance an arterial catheter was placed into the right radial artery.  Appropriate arterial tracings confirmed on monitor.     Complications/Tolerance None; patient tolerated the procedure well.   EBL Minimal   Specimen(s) None   No complications appreciated. L arterial line with abnormal waveform and despite trouble shooting unable to resolve. Pt req tight BP control 2/2 vasospasm and so emergent replacement of arterial line performed.

## 2021-01-13 NOTE — Progress Notes (Signed)
Pt having sustained SBP 230-250. Pt given 20 mg Hydralazine per PRN order and BP remains unchanged. Dr. Gaynell Face notified and Labetalol IV ordered.

## 2021-01-13 NOTE — Progress Notes (Signed)
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   Significant Hospital Events: Including procedures, antibiotic start and stop dates in addition to other pertinent events    On 5/17 presenting with headache, altered mental status. STAT CT head showed diffuse SAH w/ associated hydrocephalus.  He was seen by neuro surgical team w/ plan to proceed w/ ventriculostomy drain prior to surgical intervention. PCCM asked to assist w/care.  Started on unasyn for suspected aspiration.  Cultures sent.  R subclavian CVL and right aline  5/18-> Patient had extensor posturing last night, EVD was lowered from 15-10 and this morning it was lowered to 0, CSF is trending.  CT head was done last night which shows increasing hydrocephalus, despite EVD remain in good position and draining well.   TCD neg.  EEG neg  5/19 -> better neuro exam following commands L> R but still having abnormal extensor movements as well, EVD at 0, TCDs/ EEG neg, afebrile, remains on cleviprex, failed SBT due to bradypnea, not requiring sedation, went for successful pipeline shield embolization of RVA aneurysm   5/20 -> S/p successful pipeline shield embolization of RVA aneurysm 5/19 afternoon w/NSGY.  Fentanyl gtt added overnight by EMD for agitation, vent dyssynchrony and reaching for ETT both w/ UEs, currently at 50 mcg/hr.   Remains on cleviprex gtt at 14 mg/ hr, lisinopril added.  Following specific commands today, and no more posturing noted-> had epidsode of decreased responsive, CTH repeated, stable, expected  changes.  Vomited x 1- sats low 90s.  EVD remains 0, slightly sluggish overnight but now draining well, 118 ml/ 24hrs.  Afebrile, UOP 1.3L/ 24 hrs, trial of foley removal, +382 ml/ net - 714  5/23: Klebsiella sensitivities obtained; Meropenem changed to Ceftriaxone 2 g  IV 200 ml/hr. IVF 125 ml/hr  5/24 : Patient vomited x2, PRN zofran given. Patient with hiccups and not getting volumes on ventilator according to bedside nurse and RT. Ventilator switch out.  Thorazine 25 mg IV X 1 to improve hiccups.   5/25: SBP goal between 180-220.  Free water increased to 200 ml qhr.   Interim History / Subjective:   5/26: remains intubated and sedated. Overnight, one destat event to 89, otherwise pt is oxygenating in the 90's. Pt has been febrile with a tmax of 102.2. Fever was reducible with tylenol and cooling blanket. Pt's fever likely neurogenic but given that he is being treated for aspiration pna, he will continued to be monitored for signs for infection. Procalcitonin baseline for <0.10; pended for tomorrow also. Given pt's baseline sepsis less likely.   Central line removed today; will continue to monitored for signs for infection. After 24 hr, if pt continues to be febrile blood culture should be collected.  PICC line should be considered at that time.    Objective   Blood pressure (!) 229/76, pulse 80, temperature (!) 101.4 F (38.6 C), temperature source Esophageal, resp. rate 18, weight 96.8 kg, SpO2 93 %.    Vent Mode: PRVC FiO2 (%):  [40 %-60 %] 40 % Set Rate:  [16 bmp] 16  bmp Vt Set:  [610 mL] 610 mL PEEP:  [8 cmH20] 8 cmH20 Plateau Pressure:  [19 cmH20-22 cmH20] 22 cmH20   Intake/Output Summary (Last 24 hours) at 01/13/2021 0813 Last data filed at 01/13/2021 0700 Gross per 24 hour  Intake 6715.9 ml  Output 3835 ml  Net 2880.9 ml   Filed Weights   01/10/21 0500 01/11/21 0412 01/13/21 0451  Weight: 95 kg 96.1 kg 96.8 kg    Examination: General:  Critically ill adult male lying  in bed in NAD, intermittent hiccups HEENT: MM pink/moist, ETT/ cortrak, pupils 2/sluggish, some scleral edema, right frontal EVD at 0 Neuro: opens eyes to voice, f/c L > R(no movement to gravity) intermittently CV: NSR, no murmur PULM:  Non labored currently, some dyssynchrony at times- due to agitation and hiccups GI: soft, bs hyper active, NT, foley  Extremities: warm/dry, generalized +1 edema  Skin: no rashes    Labs/imaging that I havepersonally reviewed  (right click and "Reselect all SmartList Selections" daily)   5/26:  Similar persistent asymmetric bibasilar airspace disease, left greater than right, with probable small left effusion.  5/25: CXR - Endotracheal tube slightly withdrawn at the level of the clavicular Heads. Improved pulmonary insufflation.  5/23 CXR - Low lung volumes with slightly worsened bilateral lower lobe to mid lung zone patchy airspace opacities.  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 Hypertension  S/p  pipeline shield embolization of RVA aneurysm 5/19 - per NSGY  - increased SBP goals of 180-220 per neurosx -titration of norepi to achieve goals, tcd still with elevated ratios - Cont nimodipine 60mg  q 4hrs - cont keppra for seizure ppx - Cont EVD drainage open at 0: per neurosx -asa/plavix, recheck cbc with diff in am  - avoid apresoline with SAH - holding bowel regimen  - further imaging per NSGY    - remove cvc (LINE HOLIDAY) -keep aline for now  - PT/ OT when able  - guarded prognosis    Acute hypoxic respiratory failure   Klebsiella pna Repeated aspiration event 5/20 with vomiting episode - Continue MV support, 4-8cc/kg IBW with goal Pplat <30 and DP<15  - VAP prevention protocol/ PPI - PAD protocol for sedation> continue low dose fentanyl for comfort. RASS goal 0/-1 -  FiO2 as able for SpO2 >92%  - continued attempts at sbt. With waxing and waning mental status concern about airway protection at this time or consistent ability to clear secretions.  - continue Ceftriaxone 2 g  IV 200 ml/hr.  4/5 days   - stable currently FiO2 of 40; O2 of 93%. - 5/26 CXR - similar to 5/25, personally reviewed by me   Fever:  -persists -removing lines -no foley already  -sending pct -cxr without new infiltrate and actually resolving -cont antipyretic and ctx. Should cont to fever and elevated pct may warrant cx +/- broadening abx but should pct and wbc remain normal then suspect fever is 2/2 neuro.   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 Demand cardiac ischemia TTE 5/18, EF 60-65%, normal RV, indeterminate diastolic, ? small regional  wall motion abnormalities (mid anteroseptum).  Trop hs trend 251- 702- 825.  EKG non acute.  amio gtt stopped 5/18.  Consider cardiology consult depending on neuro recovery  - remains NSR.  Continue tele - Mag goal > 2, K > 4  AKI - initially due to dehydration, resolved; increased sCr 5/21 after foley removed; foley replaced 5/21. Foley removed 5/25  - improving renal function and good UOP - trend renal indices/ strict I/Os - Replace electrolytes as indicated - Avoid nephrotoxic agents, ensure adequate renal perfusion - Continue retention agents - Cr 1.07 on 5/25; improved from 1.36 5/22    Hypernatremia Hyperchloremic  - 5/26 Na 144 --> 5/25 152; improved -> 144 -free water ongoing, but with decreased sodium level on afternoon check will decrease rate - trend BMET in am   Diabetes, new diagnosis - A1c 6.5 - currently below 200  - continue SSI moderate -  continue TF 7 units q 4hr - continue levemir 10 units BID  Leukocytosis-Kleb pneumonia related to aspiration, improved, 5/25 WBC 10.1  - ongoing fever likely neurogenic 5/22 - ongoing tylenol and prn cooling blanket for fever - follow cultures- BC 1/3 w/GPC, suspected contamination - Procalcitonin baseline <0.10; Procalcitonin  Pended for 5/27. - Central line removed 5/26 - CBC w/ diff pending for 5/27   Normocytic anemia - H/H stable - trend CBC, transfuse for < 7 - Hb 9.9 5/25   Best practice (right click and "Reselect all SmartList Selections" daily)  Diet:  NPO,  TF 5/19, cortrak placed 5/20 Pain/Anxiety/Delirium protocol (if indicated): Yes (RASS goal 0) VAP protocol (if indicated): Yes DVT prophylaxis: SCD only  GI prophylaxis: PPI Glucose control:  SSI Yes Central venous access:  N/A removed 5/26 Arterial line:  Yes, and it is still needed Foley:  N/A removed 5/25 UPDATE: 5/26 replaced 2/2 large volume retention q6h Mobility:  bed rest  PT consulted: N/A Last date of multidisciplinary goals of care discussion [Per primary team] Code Status:  full code Disposition: ICU       Wendee Beavers Sweet MS4  Critical care time: The patient is critically ill with multiple organ systems failure and requires high complexity decision making for assessment and support, frequent evaluation and titration of therapies, application of advanced monitoring technologies and extensive interpretation of multiple databases.  Critical care time 37 mins. This represents my time independent of the NPs time taking care of the pt. This is excluding procedures.    Briant Sites DO  Pulmonary and Critical Care 01/13/2021, 8:13 AM See Amion for pager If no response to pager, please call [phone] until 1900 After 1900 please call [facility] [phone]
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
## 2021-01-13 NOTE — Progress Notes (Signed)
Shawn Lane waveform showing inaccurate readings evidenced by tracing on monitor. Dr. Gaynell Face aware and will assess before beginning additional measures for BP management.

## 2021-01-13 NOTE — Progress Notes (Signed)
Pt con't to have SBP >220 after intermittent improvement after Hydralazine and Labetalol. Dr. Gaynell Face notified and Cardene orders will be placed.

## 2021-01-14 ENCOUNTER — Inpatient Hospital Stay (HOSPITAL_COMMUNITY): Payer: 59

## 2021-01-14 DIAGNOSIS — R410 Disorientation, unspecified: Secondary | ICD-10-CM

## 2021-01-14 DIAGNOSIS — G9341 Metabolic encephalopathy: Secondary | ICD-10-CM | POA: Diagnosis not present

## 2021-01-14 DIAGNOSIS — I609 Nontraumatic subarachnoid hemorrhage, unspecified: Secondary | ICD-10-CM | POA: Diagnosis not present

## 2021-01-14 DIAGNOSIS — J9601 Acute respiratory failure with hypoxia: Secondary | ICD-10-CM | POA: Diagnosis not present

## 2021-01-14 LAB — CBC WITH DIFFERENTIAL/PLATELET
Abs Immature Granulocytes: 0.09 10*3/uL — ABNORMAL HIGH (ref 0.00–0.07)
Basophils Absolute: 0 10*3/uL (ref 0.0–0.1)
Basophils Relative: 0 %
Eosinophils Absolute: 0.2 10*3/uL (ref 0.0–0.5)
Eosinophils Relative: 2 %
HCT: 29.8 % — ABNORMAL LOW (ref 39.0–52.0)
Hemoglobin: 9.3 g/dL — ABNORMAL LOW (ref 13.0–17.0)
Immature Granulocytes: 1 %
Lymphocytes Relative: 12 %
Lymphs Abs: 1.2 10*3/uL (ref 0.7–4.0)
MCH: 29.1 pg (ref 26.0–34.0)
MCHC: 31.2 g/dL (ref 30.0–36.0)
MCV: 93.1 fL (ref 80.0–100.0)
Monocytes Absolute: 0.8 10*3/uL (ref 0.1–1.0)
Monocytes Relative: 8 %
Neutro Abs: 8.2 10*3/uL — ABNORMAL HIGH (ref 1.7–7.7)
Neutrophils Relative %: 77 %
Platelets: 248 10*3/uL (ref 150–400)
RBC: 3.2 MIL/uL — ABNORMAL LOW (ref 4.22–5.81)
RDW: 13.2 % (ref 11.5–15.5)
WBC: 10.6 10*3/uL — ABNORMAL HIGH (ref 4.0–10.5)
nRBC: 0 % (ref 0.0–0.2)

## 2021-01-14 LAB — GLUCOSE, CAPILLARY
Glucose-Capillary: 142 mg/dL — ABNORMAL HIGH (ref 70–99)
Glucose-Capillary: 149 mg/dL — ABNORMAL HIGH (ref 70–99)
Glucose-Capillary: 175 mg/dL — ABNORMAL HIGH (ref 70–99)
Glucose-Capillary: 194 mg/dL — ABNORMAL HIGH (ref 70–99)
Glucose-Capillary: 220 mg/dL — ABNORMAL HIGH (ref 70–99)
Glucose-Capillary: 82 mg/dL (ref 70–99)

## 2021-01-14 LAB — PROCALCITONIN: Procalcitonin: <0.1 ng/mL

## 2021-01-14 LAB — BASIC METABOLIC PANEL
Anion gap: 6 (ref 5–15)
BUN: 29 mg/dL — ABNORMAL HIGH (ref 8–23)
CO2: 27 mmol/L (ref 22–32)
Calcium: 8 mg/dL — ABNORMAL LOW (ref 8.9–10.3)
Chloride: 114 mmol/L — ABNORMAL HIGH (ref 98–111)
Creatinine, Ser: 1 mg/dL (ref 0.61–1.24)
GFR, Estimated: >60 mL/min (ref >60)
Glucose, Bld: 113 mg/dL — ABNORMAL HIGH (ref 70–99)
Potassium: 3.8 mmol/L (ref 3.5–5.1)
Sodium: 147 mmol/L — ABNORMAL HIGH (ref 135–145)

## 2021-01-14 MED ORDER — NUTRISOURCE FIBER PO PACK
1.0000 | PACK | Freq: Two times a day (BID) | ORAL | Status: DC
  Administered 2021-01-14 – 2021-01-16 (×5): 1
  Filled 2021-01-14 (×6): qty 1

## 2021-01-14 MED ORDER — NOREPINEPHRINE 4 MG/250ML-% IV SOLN
0.0000 ug/min | INTRAVENOUS | Status: DC
  Administered 2021-01-14: 2 ug/min via INTRAVENOUS
  Administered 2021-01-14 – 2021-01-15 (×4): 10 ug/min via INTRAVENOUS
  Filled 2021-01-14 (×4): qty 250

## 2021-01-14 MED ORDER — NOREPINEPHRINE 4 MG/250ML-% IV SOLN
INTRAVENOUS | Status: AC
  Administered 2021-01-14: 4 mg
  Filled 2021-01-14: qty 250

## 2021-01-14 MED ORDER — PHENYLEPHRINE HCL-NACL 10-0.9 MG/250ML-% IV SOLN
25.0000 ug/min | INTRAVENOUS | Status: DC
  Administered 2021-01-15 (×3): 145 ug/min via INTRAVENOUS
  Administered 2021-01-15: 140 ug/min via INTRAVENOUS
  Administered 2021-01-15: 145 ug/min via INTRAVENOUS
  Administered 2021-01-15: 120 ug/min via INTRAVENOUS
  Administered 2021-01-15: 145 ug/min via INTRAVENOUS
  Administered 2021-01-15: 85 ug/min via INTRAVENOUS
  Administered 2021-01-15: 25 ug/min via INTRAVENOUS
  Filled 2021-01-14 (×2): qty 500
  Filled 2021-01-14: qty 250
  Filled 2021-01-14: qty 500
  Filled 2021-01-14 (×3): qty 250

## 2021-01-14 MED ORDER — SODIUM CHLORIDE 0.9 % IV SOLN
250.0000 mL | INTRAVENOUS | Status: DC
  Administered 2021-01-15: 250 mL via INTRAVENOUS

## 2021-01-14 MED ORDER — FREE WATER
200.0000 mL | Freq: Four times a day (QID) | Status: DC
  Administered 2021-01-14 – 2021-01-16 (×8): 200 mL

## 2021-01-14 MED ORDER — SODIUM CHLORIDE 0.9 % IV SOLN: 250.0000 mL | INTRAVENOUS | Status: DC

## 2021-01-14 NOTE — Progress Notes (Signed)
Art line reading 50-60 mmHg higher than cuff (systolic), MAPs about the same. Think line underdamped. Discussed with CCM provider.

## 2021-01-14 NOTE — Progress Notes (Addendum)
Patient was running a fever of 100.9 F at 2000, blankets were removed from patient. At 0000 patients fever was 101.7 F tylenol was given with no relief so the patient was placed back on cooling blanket. Patients temperature has since come down to 100.8 F.

## 2021-01-14 NOTE — Progress Notes (Signed)
Levo maxed, SBP<180, E-link aware.

## 2021-01-14 NOTE — Progress Notes (Signed)
Transcranial Doppler  Date POD PCO2 HCT BP  MCA ACA PCA OPHT SIPH VERT Basilar  5/18 [facility]     Right  Left   37  40   -40  -28   28  28   24  22   31  23    -58  -33         5/20 MR     Right  Left   62  75   -34  -78   57  41   28  26   43  53   *  *   *      5/23 [facility]     Right  Left   90  171   -45  -58   76  38   22  35   42  23   -55  -38   *      5/25 RH     Right  Left   114  130   -30  -36   49  33   16  17   45  28   -47  -29   -95      5/27 [facility]      Right  Left   187  277   -82  -66.3   123  63.2   52.7  53.7   126  48.6   -121  -102   -103  *         Right  Left                                            Right  Left                                        MCA = Middle Cerebral Artery OPHT = Opthalmic Artery BASILAR = Basilar Artery  ACA = Anterior Cerebral Artery SIPH = Carotid Siphon PCA = Posterior Cerebral Artery VERT = Verterbral Artery   Normal MCA = 62+\-12 ACA = 50+\-12 PCA = 42+\-23   RT [place] = 4.81  LT [place] = 3.99  Results can be found under chart review under CV PROC. 01/14/2021 3:22 PM Conny Moening RVT, RDMS

## 2021-01-14 NOTE — Progress Notes (Signed)
Cuff and art line currently reading both 150s.... instructed to resume levophed by Dr Conchita Paris. CCM team notified of need for central access.

## 2021-01-14 NOTE — Progress Notes (Addendum)
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   Significant Hospital Events: Including procedures, antibiotic start and stop dates in addition to other pertinent events    On 5/17 presenting with headache, altered mental status. STAT CT head showed diffuse SAH w/ associated hydrocephalus.  He was seen by neuro surgical team w/ plan to proceed w/ ventriculostomy drain prior to surgical intervention. PCCM asked to assist w/care.  Started on unasyn for suspected aspiration.  Cultures sent.  R subclavian CVL and right aline  5/18-> Patient had extensor posturing last night, EVD was lowered from 15-10 and this morning it was lowered to 0, CSF is trending.  CT head was done last night which shows increasing hydrocephalus, despite EVD remain in good position and draining well.   TCD neg.  EEG neg  5/19 -> better neuro exam following commands L> R but still having abnormal extensor movements as well, EVD at 0, TCDs/ EEG neg, afebrile, remains on cleviprex, failed SBT due to bradypnea, not requiring sedation, went for successful pipeline shield embolization of RVA aneurysm   5/20 -> S/p successful pipeline shield embolization of RVA aneurysm 5/19 afternoon w/NSGY.  Fentanyl gtt added overnight by EMD for agitation, vent dyssynchrony and reaching for ETT both w/ UEs, currently at 50 mcg/hr.   Remains on cleviprex gtt at 14 mg/ hr, lisinopril added.  Following specific commands today, and no more posturing noted-> had epidsode of decreased responsive, CTH repeated, stable, expected  changes.  Vomited x 1- sats low 90s.  EVD remains 0, slightly sluggish overnight but now draining well, 118 ml/ 24hrs.  Afebrile, UOP 1.3L/ 24 hrs, trial of foley removal, +382 ml/ net - 714  5/23: Klebsiella sensitivities obtained; Meropenem changed to Ceftriaxone 2 g  IV 200 ml/hr. IVF 125 ml/hr  5/24 : Patient vomited x2, PRN zofran given. Patient with hiccups and not getting volumes on ventilator according to bedside nurse and RT. Ventilator switch out.  Thorazine 25 mg IV X 1 to improve hiccups.   5/25: SBP goal between 180-220.  Free water increased to 200 ml qhr.   5/26: Pt febrile with a tmax of 102.2. Fever likely neurogenic but given that he is being treated for aspiration pna, he will continued to be monitored for signs for infection. Procalcitonin baseline for <0.10; sepsis less likely. Arterial Line showing inaccurate readings; replaced.   Interim History / Subjective:   5/27: Remains intubated and sedated. Overnight, patient was febrile with tmax of 101.9. fever was reducible with antipyretics and cooling blanket. WBC of 10.6; increased from 10.1 on 5/25. Pct of <0.10; sepsis unlikely. Fevers are most likely neurogenic.  Arterial line reading 50-60 mmHg higher than cuff (systolic), MAPs about the same. Given pt presentation, cuff is likely more accurate.    Talked with Pt's family about possible tractotomy. Discussed the risk / benefit of tractotomy vs intubation.   Central line was removed yesterday; currently on line holiday.   Objective   Blood pressure (!) 154/80, pulse 76, temperature 98.8 F (37.1 C), temperature source Esophageal, resp. rate (!) 27,  weight 96.9 kg, SpO2 94 %.    Vent Mode: CPAP;PSV FiO2 (%):  [40 %-50 %] 40 % Set Rate:  [16 bmp-49 bmp] 16 bmp Vt Set:  [610 mL] 610 mL PEEP:  [8 cmH20] 8 cmH20 Pressure Support:  [5 cmH20-12 cmH20] 12 cmH20 Plateau Pressure:  [18 cmH20] 18 cmH20   Intake/Output Summary (Last 24 hours) at 01/14/2021 1356 Last data  filed at 01/14/2021 1300 Gross per 24 hour  Intake 4166.08 ml  Output 4870 ml  Net -703.92 ml   Filed Weights   01/11/21 0412 01/13/21 0451 01/14/21 0500  Weight: 96.1 kg 96.8 kg 96.9 kg    Examination: General:  Critically ill adult male lying in bed in NAD, intermittent hiccups HEENT: MM pink/moist, ETT/ cortrak, pupils 2/sluggish, some scleral edema, right frontal EVD at 0 Neuro: opens eyes to voice, f/c L > R(no movement to gravity) intermittently. Can move left arm purposefully; right arm minimal movement.   CV: NSR, no murmur PULM:  Non labored currently, some dyssynchrony at times- due to agitation and hiccups GI: soft, bs hyper active, NT, foley  Extremities: warm/dry, generalized +1 edema  Skin: no rashes    Labs/imaging that I havepersonally reviewed  (right click and "Reselect all SmartList Selections" daily)   5/26:  CXR - Similar persistent asymmetric bibasilar airspace disease, left greater than right, with probable small left effusion.  5/25: CXR - Endotracheal tube slightly withdrawn at the level of the clavicular Heads. Improved pulmonary insufflation.  5/23 CXR - Low lung volumes with slightly worsened bilateral lower lobe to mid lung zone patchy airspace opacities.  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 Acute metabolic encephalopathy Hypertension  S/p  pipeline shield embolization of RVA aneurysm 5/19 - per NSGY SBP goal 180-220 - Cont norepinephrine,  Cont nimodipine 60mg  q 4hrs - cont keppra for seizure ppx - Cont EVD drainage open  at 0: per neurosx - cont asa/plavix  - labetalol 10 mg prn for SBP >220  - avoid apresoline with SAH - holding bowel regimen  - further imaging per NSGY    - A-line likely more elevated than Cuff - consulting PT/OT  - guarded prognosis    Acute hypoxic respiratory failure  Klebsiella pna Repeated aspiration event 5/20 with vomiting episode - Continue MV support, 4-8cc/kg IBW with goal Pplat <30 and DP<15  - VAP prevention protocol/ PPI - PAD protocol for sedation> continue low dose fentanyl for comfort. RASS goal 0/-1 -  FiO2 as able for SpO2 >92%  - continued attempts at sbt. With waxing and waning mental status concern about airway protection at this time or consistent ability to clear secretions. Discussed trach and peg likely in the future with patient's wife and mother. - continue Ceftriaxone 2 g  IV 200 ml/hr.  5/5 days    Fever, Leukocytosis: -persists -central lines removed 5/26.  -cxr 5/27 without new infiltrate and actually resolving -cont antipyretic and ctx. - Pct < 0.10, slightly elevated WBC of 10.6; given this fever likely neurogenic - ongoing fever likely neurogenic 5/22 - ongoing tylenol and  prn cooling blanket for fever - follow cultures- BC 1/3 w/GPC, suspected contamination - Procalcitonin baseline <0.10; Procalcitonin 5/27 <0.10 - Central line removed 5/26 (Line Holiday) - CBC w/ diff pending for 5/27  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 Demand cardiac ischemia TTE 5/18, EF 60-65%, normal RV, indeterminate diastolic, ? small regional wall motion abnormalities (mid anteroseptum).  Trop hs trend 251- 702- 825.  EKG non acute.  amio gtt stopped 5/18.   - remains NSR.  Continue tele - Mag goal > 2, K > 4  AKI - initially due to dehydration, resolved; increased sCr 5/21 after foley removed; foley replaced 5/21. Foley removed 5/25  - improving renal function and good UOP - trend renal indices/ strict I/Os - Replace electrolytes as indicated -  Avoid nephrotoxic agents, ensure adequate renal perfusion - Continue retention agents - Cr 1.07 on 5/25; improved from 1.36 5/22    Hypernatremia Hyperchloremic  - 5/27 Na 147, FWD 1.3 L -free water ongoing, decreased to 200 ml/ q6 - trend BMET in am  Diabetes, new diagnosis - A1c 6.5 - currently below 200  - continue SSI moderate - continue TF 7 units q 4hr - continue levemir 10 units BID  Normocytic anemia - H/H stable - trend CBC, transfuse for < 7 - Hb 9.9 5/25   Best practice (right click and "Reselect all SmartList Selections" daily)  Diet:  NPO,  TF 5/19, cortrak placed 5/20 Pain/Anxiety/Delirium protocol (if indicated): Yes (RASS goal 0) VAP protocol (if indicated): Yes DVT prophylaxis: SCD only  GI prophylaxis: PPI Glucose control:  SSI Yes Central venous access:  N/A removed 5/26 Arterial line:  Yes, and it is still needed Foley:  N/A removed 5/25 UPDATE: 5/26 replaced 2/2 large volume retention q6h Mobility:  bed rest  PT consulted: N/A Last date of multidisciplinary goals of care discussion [full scope of care, discussed with patient's wife and mother at bedside 5/27] Code Status:  full code Disposition: ICU       Doran Clay MS4  The patient is critically ill due to respiratory failure, subaracnoid hemorrhage, encephalopathy.  Critical care was necessary to treat or prevent imminent or life-threatening deterioration.  Critical care was time spent personally by me on the following activities: development of treatment plan with patient and/or surrogate as well as nursing, discussions with consultants, evaluation of patient's response to treatment, examination of patient, obtaining history from patient or surrogate, ordering and performing treatments and interventions, ordering and review of laboratory studies, ordering and review of radiographic studies, pulse oximetry, re-evaluation of patient's condition and participation in multidisciplinary rounds.    Critical Care Time devoted to patient care services described in this note is 37 minutes. This time reflects time of care of this signee Charlott Holler . This critical care time does not reflect separately billable procedures or procedure time, teaching time or supervisory time of PA/NP/Med student/Med Resident etc but could involve care discussion time.       Charlott Holler [facility] 01/14/2021 2:05 PM  Pager: see AMION  If no response to pager , please call critical care on call (see AMION) until 7pm After 7:00 pm call Elink

## 2021-01-14 NOTE — Progress Notes (Signed)
Patient's temperature has maintained normothermia (98.8 F), cooling blanket removed.

## 2021-01-14 NOTE — Progress Notes (Signed)
Pt left pupil >Rt. Still opening eyes, moving left side purposefully. Dr Conchita Paris notified.

## 2021-01-14 NOTE — Progress Notes (Signed)
eLink Physician-Brief Progress Note Patient Name: Shawn Lane DOB: Sep 17, 1960 MRN: [id number]   Date of Service  01/14/2021  HPI/Events of Note  Patient only has peripheral IV's and is falling short of his BP target on maximum infusion rate of peripheral Norepinephrine.  eICU Interventions  Peripheral Phenylephrine gtt ordered to be started if patient fails to meet SBP target on max dose Levo.        Thomasene Lot Huma Imhoff 01/14/2021, 11:40 PM

## 2021-01-14 NOTE — Progress Notes (Signed)
eLink Physician-Brief Progress Note Patient Name: Shawn Lane DOB: 1961-05-27 MRN: [id number]   Date of Service  01/14/2021  HPI/Events of Note  Patient needs a.m. labs ordered.  eICU Interventions  CBC and BMP ordered for the a.m.        Migdalia Dk 01/14/2021, 9:34 PM

## 2021-01-14 NOTE — Progress Notes (Signed)
  NEUROSURGERY PROGRESS NOTE   No issues overnight. Pt remains intubated.  EXAM:  BP (!) 176/81   Pulse 77   Temp 98.8 F (37.1 C) (Esophageal) Comment: patient off cooling blanket  Resp (!) 24   Wt 96.9 kg   SpO2 97%   BMI 29.38 kg/m   Eyes open spontaneously Pupils 2-73mm, reactive Breathing over vent Purposeful movement RUE/RLE, not following commands Minimal movements LUE/LLE   IMPRESSION:  60 y.o. male SAH d# 10 s/p Pipeline embolization of likely dissection RVA aneurysm. Remains largely neurologically stable with likely vasospasm and right weakness.   VDRF  PLAN: - Cont hyperdynamic treatment, goal SBP - Cont EVD drainage - Vent mgmt per PCCM - Cont Nimotop, Keppra   [name], MD [facility]

## 2021-01-15 ENCOUNTER — Inpatient Hospital Stay: Payer: Self-pay

## 2021-01-15 ENCOUNTER — Inpatient Hospital Stay (HOSPITAL_COMMUNITY): Payer: 59 | Admitting: Registered Nurse

## 2021-01-15 ENCOUNTER — Inpatient Hospital Stay (HOSPITAL_COMMUNITY): Payer: 59

## 2021-01-15 DIAGNOSIS — J810 Acute pulmonary edema: Secondary | ICD-10-CM | POA: Diagnosis not present

## 2021-01-15 DIAGNOSIS — G9341 Metabolic encephalopathy: Secondary | ICD-10-CM | POA: Diagnosis not present

## 2021-01-15 DIAGNOSIS — J9601 Acute respiratory failure with hypoxia: Secondary | ICD-10-CM | POA: Diagnosis not present

## 2021-01-15 DIAGNOSIS — M7989 Other specified soft tissue disorders: Secondary | ICD-10-CM

## 2021-01-15 DIAGNOSIS — I469 Cardiac arrest, cause unspecified: Secondary | ICD-10-CM | POA: Diagnosis not present

## 2021-01-15 DIAGNOSIS — E870 Hyperosmolality and hypernatremia: Secondary | ICD-10-CM

## 2021-01-15 DIAGNOSIS — I609 Nontraumatic subarachnoid hemorrhage, unspecified: Secondary | ICD-10-CM | POA: Diagnosis not present

## 2021-01-15 LAB — CBC WITH DIFFERENTIAL/PLATELET
Abs Immature Granulocytes: 0.2 10*3/uL — ABNORMAL HIGH (ref 0.00–0.07)
Basophils Absolute: 0.1 10*3/uL (ref 0.0–0.1)
Basophils Relative: 0 %
Eosinophils Absolute: 0.3 10*3/uL (ref 0.0–0.5)
Eosinophils Relative: 2 %
HCT: 29.7 % — ABNORMAL LOW (ref 39.0–52.0)
Hemoglobin: 9.3 g/dL — ABNORMAL LOW (ref 13.0–17.0)
Immature Granulocytes: 1 %
Lymphocytes Relative: 8 %
Lymphs Abs: 1.5 10*3/uL (ref 0.7–4.0)
MCH: 28.6 pg (ref 26.0–34.0)
MCHC: 31.3 g/dL (ref 30.0–36.0)
MCV: 91.4 fL (ref 80.0–100.0)
Monocytes Absolute: 1.1 10*3/uL — ABNORMAL HIGH (ref 0.1–1.0)
Monocytes Relative: 6 %
Neutro Abs: 15.2 10*3/uL — ABNORMAL HIGH (ref 1.7–7.7)
Neutrophils Relative %: 83 %
Platelets: 346 10*3/uL (ref 150–400)
RBC: 3.25 MIL/uL — ABNORMAL LOW (ref 4.22–5.81)
RDW: 12.9 % (ref 11.5–15.5)
WBC: 18.3 10*3/uL — ABNORMAL HIGH (ref 4.0–10.5)
nRBC: 0 % (ref 0.0–0.2)

## 2021-01-15 LAB — GLUCOSE, CAPILLARY
Glucose-Capillary: 197 mg/dL — ABNORMAL HIGH (ref 70–99)
Glucose-Capillary: 128 mg/dL — ABNORMAL HIGH (ref 70–99)
Glucose-Capillary: 165 mg/dL — ABNORMAL HIGH (ref 70–99)
Glucose-Capillary: 137 mg/dL — ABNORMAL HIGH (ref 70–99)
Glucose-Capillary: 193 mg/dL — ABNORMAL HIGH (ref 70–99)
Glucose-Capillary: 194 mg/dL — ABNORMAL HIGH (ref 70–99)

## 2021-01-15 LAB — PROCALCITONIN: Procalcitonin: 0.45 ng/mL

## 2021-01-15 LAB — BASIC METABOLIC PANEL
Anion gap: 9 (ref 5–15)
BUN: 25 mg/dL — ABNORMAL HIGH (ref 8–23)
CO2: 27 mmol/L (ref 22–32)
Calcium: 7.9 mg/dL — ABNORMAL LOW (ref 8.9–10.3)
Chloride: 108 mmol/L (ref 98–111)
Creatinine, Ser: 1.07 mg/dL (ref 0.61–1.24)
GFR, Estimated: >60 mL/min (ref >60)
Glucose, Bld: 171 mg/dL — ABNORMAL HIGH (ref 70–99)
Potassium: 3.7 mmol/L (ref 3.5–5.1)
Sodium: 144 mmol/L (ref 135–145)

## 2021-01-15 LAB — POCT I-STAT 7, (LYTES, BLD GAS, ICA,H+H)
Acid-Base Excess: 4 mmol/L — ABNORMAL HIGH (ref 0.0–2.0)
Bicarbonate: 28.3 mmol/L — ABNORMAL HIGH (ref 20.0–28.0)
Calcium, Ion: 1.15 mmol/L (ref 1.15–1.40)
HCT: 25 % — ABNORMAL LOW (ref 39.0–52.0)
Hemoglobin: 8.5 g/dL — ABNORMAL LOW (ref 13.0–17.0)
O2 Saturation: 98 %
Patient temperature: 99.7
Potassium: 3.9 mmol/L (ref 3.5–5.1)
Sodium: 145 mmol/L (ref 135–145)
TCO2: 29 mmol/L (ref 22–32)
pCO2 arterial: 39 mmHg (ref 32.0–48.0)
pH, Arterial: 7.471 — ABNORMAL HIGH (ref 7.350–7.450)
pO2, Arterial: 104 mmHg (ref 83.0–108.0)

## 2021-01-15 MED ORDER — NOREPINEPHRINE 4 MG/250ML-% IV SOLN
0.0000 ug/min | INTRAVENOUS | Status: DC
  Administered 2021-01-16: 10 ug/min via INTRAVENOUS
  Filled 2021-01-15: qty 250

## 2021-01-15 MED ORDER — PHENYLEPHRINE CONCENTRATED 100MG/250ML (0.4 MG/ML) INFUSION SIMPLE
25.0000 ug/min | INTRAVENOUS | Status: DC
  Administered 2021-01-15: 145 ug/min via INTRAVENOUS
  Filled 2021-01-15 (×4): qty 250

## 2021-01-15 MED ORDER — LORAZEPAM 2 MG/ML IJ SOLN
INTRAMUSCULAR | Status: AC
  Administered 2021-01-15: 2 mg via INTRAVENOUS
  Filled 2021-01-15: qty 1

## 2021-01-15 MED ORDER — LORAZEPAM 2 MG/ML IJ SOLN
INTRAMUSCULAR | Status: AC
  Administered 2021-01-15: 2 mg via INTRAVENOUS
  Filled 2021-01-15: qty 2

## 2021-01-15 MED ORDER — MANNITOL 20 % IV SOLN: 50.0000 g | Freq: Once | Status: DC

## 2021-01-15 MED ORDER — LEVETIRACETAM IN NACL 1000 MG/100ML IV SOLN
1000.0000 mg | Freq: Once | INTRAVENOUS | Status: AC
  Administered 2021-01-15: 1000 mg via INTRAVENOUS
  Filled 2021-01-15: qty 100

## 2021-01-15 MED ORDER — PROPOFOL 1000 MG/100ML IV EMUL
INTRAVENOUS | Status: AC
  Administered 2021-01-15: 40 ug/kg/min via INTRAVENOUS
  Filled 2021-01-15: qty 100

## 2021-01-15 MED ORDER — SODIUM CHLORIDE 0.9% FLUSH
10.0000 mL | Freq: Two times a day (BID) | INTRAVENOUS | Status: DC
  Administered 2021-01-15: 10 mL

## 2021-01-15 MED ORDER — LORAZEPAM 2 MG/ML IJ SOLN
2.0000 mg | INTRAMUSCULAR | Status: DC | PRN
Start: 2021-01-15 — End: 2021-01-16

## 2021-01-15 MED ORDER — PIPERACILLIN-TAZOBACTAM 3.375 G IVPB
3.3750 g | Freq: Three times a day (TID) | INTRAVENOUS | Status: DC
  Administered 2021-01-15 – 2021-01-16 (×2): 3.375 g via INTRAVENOUS
  Filled 2021-01-15 (×2): qty 50

## 2021-01-15 MED ORDER — LORAZEPAM 2 MG/ML IJ SOLN
INTRAMUSCULAR | Status: AC
  Administered 2021-01-15: 2 mg
  Filled 2021-01-15: qty 1

## 2021-01-15 MED ORDER — SODIUM CHLORIDE 0.9% FLUSH
10.0000 mL | INTRAVENOUS | Status: DC | PRN
Start: 2021-01-15 — End: 2021-01-16

## 2021-01-15 MED ORDER — LORAZEPAM 2 MG/ML IJ SOLN: 2.0000 mg | Freq: Once | INTRAMUSCULAR | Status: DC

## 2021-01-15 MED ORDER — PROPOFOL 1000 MG/100ML IV EMUL
5.0000 ug/kg/min | INTRAVENOUS | Status: DC
Start: 2021-01-15 — End: 2021-01-16
  Administered 2021-01-15: 10 ug/kg/min via INTRAVENOUS
  Filled 2021-01-15: qty 100

## 2021-01-15 MED ORDER — FUROSEMIDE 10 MG/ML IJ SOLN
40.0000 mg | Freq: Every day | INTRAMUSCULAR | Status: DC
  Administered 2021-01-15 – 2021-01-16 (×2): 40 mg via INTRAVENOUS
  Filled 2021-01-15 (×2): qty 4

## 2021-01-15 NOTE — Plan of Care (Addendum)
Called to see patient with Dr. Cyndie Chime for acute neuro change. RN reported blown pupils, had been 2+ and responsive at 11pm.  On exam, pupils 31mm and unresponsive to light bilaterally, EEG monitoring ongoing and minimal activity on monitor.    Stat head CT ordered and Neurosurgery notified.  Head CT consistent with new hemorrhage, discussed with Docia Barrier with neurosurgery who advised no intervention would be advised.  Wife notified of overnight events and likely devastating outcome.    Shawn Gasman Eliazer Hemphill, PA-C

## 2021-01-15 NOTE — Progress Notes (Signed)
OT Cancellation Note  Patient Details Name: Shawn Lane MRN: [id number] DOB: 1961-06-13   Cancelled Treatment:    Reason Eval/Treat Not Completed: Medical issues which prohibited therapy.  Pt with code blue overnight after self extubation.  He is currently reintubated and sedated with possible seizure activity.  Will reattempt.  Eber Jones., OTR/L Acute Rehabilitation Services Pager [phone] Office [phone]   Shawn Lane 01/15/2021, 10:50 AM

## 2021-01-15 NOTE — Progress Notes (Signed)
Code Blue. Patient was helped and Medical staff called family to let them know what was happening.  01/15/21 0400  Clinical Encounter Type  Visited With Health care provider  Visit Type Code  Referral From Nurse  Consult/Referral To Chaplain

## 2021-01-15 NOTE — Progress Notes (Signed)
RN walked into room, patient was holding ETT in mitted hand. Originally satting at 96% but quickly dropped to 36%. RN began bagging patient and the patient went into asystole without a pulse, compressions began, code called, reference code sheet. ROSC achieved at 0300. Pt was intubated by CRNA, reference CRNA progress note.   After code pt began having tremors with rigid extremities, CCM at bedside, multiple doses of ativan given, propofol drip started, taken for a stat head CT per CCM and Neurosurgery.   Family and Neurosurgery notified by CCM.

## 2021-01-15 NOTE — Progress Notes (Signed)
eLink Physician-Brief Progress Note Patient Name: Shawn Lane DOB: 06-28-1961 MRN: [id number]   Date of Service  01/15/2021  HPI/Events of Note  Patient self-extubated then coded, requiring a round of epinephrine and chest compressions, as well as Ambu bag ventilation to achieve ROSC.  eICU Interventions  Managed the code blue until the arrival of the [employer] crew.        Elie Leppo U [name] 01/15/2021, 3:10 AM

## 2021-01-15 NOTE — Procedures (Signed)
Arterial Catheter Insertion Procedure Note  Shawn Lane  [id number]  Feb 18, 1961  Date:01/15/21  Time:4:50 AM    Provider Performing: Hassan Buckler    Procedure: Insertion of Arterial Line (91638) without US guidance  Indication(s) Blood pressure monitoring and/or need for frequent ABGs  Consent Unable to obtain consent due to emergent nature of procedure.  Anesthesia None   Time Out Verified patient identification, verified procedure, site/side was marked, verified correct patient position, special equipment/implants available, medications/allergies/relevant history reviewed, required imaging and test results available.   Sterile Technique Maximal sterile technique including full sterile barrier drape, hand hygiene, sterile gown, sterile gloves, mask, hair covering, sterile ultrasound probe cover (if used).   Procedure Description Area of catheter insertion was cleaned with chlorhexidine and draped in sterile fashion. Without real-time ultrasound guidance an arterial catheter was placed into the right radial artery.  Appropriate arterial tracings confirmed on monitor.     Complications/Tolerance None; patient tolerated the procedure well.   EBL Minimal   Specimen(s) None

## 2021-01-15 NOTE — Progress Notes (Signed)
EEG complete - results pending 

## 2021-01-15 NOTE — Progress Notes (Signed)
PT Cancellation Note  Patient Details Name: Shawn Lane MRN: [id number] DOB: 1961-02-04   Cancelled Treatment:    Reason Eval/Treat Not Completed: Medical issues which prohibited therapy;Patient's level of consciousness. Pt coded last night and is not medically stable enough to participate in PT eval at this time. Pt is also sedated. Will follow-up another day as appropriate.   Raymond Gurney, PT, DPT Acute Rehabilitation Services  Pager: [phone] Office: [phone]    Jewel Baize 01/15/2021, 8:12 AM

## 2021-01-15 NOTE — Procedures (Signed)
  STAT Routine EEG  Shawn Lane is a 60 y.o. male with a history of SAH and cardiac arrest who is undergoing an EEG to evaluate for seizures.  Report: This EEG was acquired with electrodes placed according to the International 10-20 electrode system (including Fp1, Fp2, F3, F4, C3, C4, P3, P4, O1, O2, T3, T4, T5, T6, A1, A2, Fz, Cz, Pz). The following electrodes were missing or displaced: none.  The best background was 7-9 Hz. This activity was not clearly reactive to stimulation. Background is near continuous with intervening periods of diffuse suppression lasting up to 1-2 sec. There was no clear sleep architecture. There is focal slowing over the left hemisphere with the exception of the left frontotemporal region where there is a breach rhythm. There are occasional sharp waves with field out of that region. There were no other interictal epileptiform discharges. There were no electrographic seizures identified. Photic stimulation and hyperventilation were not performed.  Impression: This EEG was obtained while comatose and is abnormal due to intermittent diffuse slowing, focal left hemispheric slowing, left frontal breach rhythm, occasional sharp waves out of the breach rhythm of unclear epileptogenic potential, and rare intervening periods of diffuse suppression. These findings represent, generalized cerebral dysfunction, focal left hemispheric dysfunction, and left skull defect with potential (but not definitive) underlying epileptogenicity.   EEG will be prolonged for further characterization.  Bing Neighbors, MD [facility] [phone]  If 7pm- 7am, please page neurology on call as listed in AMION.

## 2021-01-15 NOTE — Progress Notes (Signed)
Neo and levo maxed, SBP <180, T-101.8, cooling blanket applied, A-line dampened and positional at times, not correlating with BP cuff pressure, going by cuff pressure, E-link aware.

## 2021-01-15 NOTE — Progress Notes (Addendum)
VASCULAR LAB    Right upper extremity venous duplex has been performed.  See CV proc for preliminary results.  Gave verbal results to Dr. Laurina Bustle, [facility], RVT 01/15/2021, 6:03 PM

## 2021-01-15 NOTE — Progress Notes (Signed)
eLink Physician-Brief Progress Note Patient Name: Shawn Lane DOB: [birth year] MRN: [id number]   Date of Service  01/15/2021  HPI/Events of Note  Patient needs Levophed upgraded from peripheral protocol to full dose range because he now has a central line, fever up to 104 in the context of self-extubation yesterday, need to r/o aspiration in light of bilateral lung infiltrates.  eICU Interventions  Levophed changed to full does, empiric Zosyn ordered, patient is receiving cooling protocols for the fever. Arterial line ordered.        Migdalia Dk 01/15/2021, 9:40 PM

## 2021-01-15 NOTE — Progress Notes (Signed)
eLink Physician-Brief Progress Note Patient Name: Shawn Lane DOB: Aug 10, 1961 MRN: [id number]   Date of Service  01/15/2021  HPI/Events of Note  Patient needs orders for a.m. labs and CXR.  eICU Interventions  Labs and CXR ordered for the a.m.        Khushi Zupko U Smokey Melott 01/15/2021, 10:00 PM

## 2021-01-15 NOTE — Progress Notes (Addendum)
Brief PCCM Note   Alerted by ELink to pt self-extubation and subsequent cardiac arrest. By my arrival pt had achieved ROSC -- 2 rounds of CPR and 1 amp epi given. It sounds like he self extubated, desaturated, brady-ed down and became pulseless.   Reintubated by CRNA CXR ABG pending   Pressors restarted. Arterial line became dislodged during compressions -- will ask RT to replace.   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  P -PRN 2mg  ativan -starting prop gtt -keppra  -STAT CT H followed by STAT EEG     NSGY and family notified      MSN, AGACNP-BC [facility] Care [facility] Amion for pager details  01/15/2021, 3:18 AM

## 2021-01-15 NOTE — Progress Notes (Signed)
Peripherally Inserted Central Catheter Placement  The IV Nurse has discussed with the patient and/or persons authorized to consent for the patient, the purpose of this procedure and the potential benefits and risks involved with this procedure.  The benefits include less needle sticks, lab draws from the catheter, and the patient may be discharged home with the catheter. Risks include, but not limited to, infection, bleeding, blood clot (thrombus formation), and puncture of an artery; nerve damage and irregular heartbeat and possibility to perform a PICC exchange if needed/ordered by physician.  Alternatives to this procedure were also discussed.  Bard Power PICC patient education guide, fact sheet on infection prevention and patient information card has been provided to patient /or left at bedside.  Consent obtained by bedside RN, Loletta Specter, from wife.  Wife at bedside upon arrival, all questions answered.  PICC Placement Documentation  PICC Triple Lumen 01/15/21 PICC Left Basilic 45 cm 0 cm (Active)  Indication for Insertion or Continuance of Line Vasoactive infusions 01/15/21 1319  Exposed Catheter (cm) 0 cm 01/15/21 1319  Site Assessment Clean;Dry;Intact 01/15/21 1319  Lumen #1 Status Flushed;Saline locked;Blood return noted 01/15/21 1319  Lumen #2 Status Flushed;Blood return noted;Saline locked 01/15/21 1319  Lumen #3 Status Saline locked;Flushed;Blood return noted 01/15/21 1319  Dressing Type Transparent 01/15/21 1319  Dressing Status Clean;Dry;Intact 01/15/21 1319  Antimicrobial disc in place? Yes 01/15/21 1319  Safety Lock Not Applicable 01/15/21 1319  Line Care Connections checked and tightened 01/15/21 1319  Line Adjustment (NICU/IV Team Only) No 01/15/21 1319  Dressing Intervention New dressing 01/15/21 1319  Dressing Change Due 01/22/21 01/15/21 1319       Shawn Lane 01/15/2021, 1:19 PM

## 2021-01-15 NOTE — Progress Notes (Signed)
Subjective: Patient reports intubated, sedated  Objective: Vital signs in last 24 hours: Temp:  [99.7 F (37.6 C)-100.3 F (37.9 C)] 99.7 F (37.6 C) (05/28 0400) Pulse Rate:  [67-105] 74 (05/28 0800) Resp:  [0-33] 16 (05/28 0800) BP: (104-194)/(63-168) 186/97 (05/28 0800) SpO2:  [36 %-100 %] 99 % (05/28 0800) Arterial Line BP: (89-181)/(62-117) 126/117 (05/28 0800) FiO2 (%):  [40 %-60 %] 60 % (05/28 0816)  Intake/Output from previous day: 05/27 0701 - 05/28 0700 In: 3968.5 [I.V.:1378.7; NG/GT:2390; IV Piggyback:199.8] Out: 4433 [Urine:2800; Drains:183; Stool:1450] Intake/Output this shift: Total I/O In: 80.1 [I.V.:20.1; NG/GT:60] Out: 703 [Urine:700; Drains:3]  Physical Exam: Patient is intubated and unresponsive. PERRL.  (Given 8 mm iv ativan for presumed seizure).  Minimal movement to noxious stimuli.    Lab Results: Recent Labs    01/14/21 0500 01/15/21 0500 01/15/21 0530  WBC 10.6*  --  18.3*  HGB 9.3* 8.5* 9.3*  HCT 29.8* 25.0* 29.7*  PLT 248  --  346   BMET Recent Labs    01/14/21 0500 01/15/21 0500 01/15/21 0530  NA 147* 145 144  K 3.8 3.9 3.7  CL 114*  --  108  CO2 27  --  27  GLUCOSE 113*  --  171*  BUN 29*  --  25*  CREATININE 1.00  --  1.07  CALCIUM 8.0*  --  7.9*    Studies/Results: CT HEAD WO CONTRAST  Result Date: 01/15/2021 CLINICAL DATA:  Stroke follow-up.  Cardiac arrest EXAM: CT HEAD WITHOUT CONTRAST TECHNIQUE: Contiguous axial images were obtained from the base of the skull through the vertex without intravenous contrast. COMPARISON:  Eight days ago FINDINGS: Brain: Diminished subarachnoid hemorrhage now seen to a mild degree along the convexities and in the prepontine cistern. Decreased intraventricular hemorrhage is well. A interhemispheric subdural hematoma is essentially resolved. Small volume low-density subdural along the right frontal convexity, only 4 mm in thickness. Right frontal approach EVD with tip near the hypothalamus,  ventricles are slit-like and decreased. 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 Vascular: Right vertebral stenting. Skull: Unremarkable left craniotomy Sinuses/Orbits: Negative IMPRESSION: 1. Decreased intracranial hemorrhage. 2. Right frontal EVD with slit-like ventricles. 3. No visible progression of infarcts. 4. Small volume low-density subdural collection along the right cerebral convexity, only 4 mm in thickness. Electronically Signed   By: Marnee Spring M.D.   On: 01/15/2021 04:09   DG Chest Port 1 View  Result Date: 01/15/2021 CLINICAL DATA:  Acute respiratory failure EXAM: PORTABLE CHEST 1 VIEW COMPARISON:  01/13/2021 FINDINGS: Single frontal view of the chest demonstrates stable position of the endotracheal tube. Enteric catheter passes below diaphragm tip excluded by collimation. External defibrillator pads overlie the chest. The right-sided central venous catheter seen previously has been removed in the interim. Cardiac silhouette is stable. There is increased bilateral perihilar airspace disease. The left pleural effusion seen previously is no longer identified. No pneumothorax. IMPRESSION: 1. Bilateral perihilar airspace disease, increased since prior study, consistent with progressive edema. 2. Support devices as above. Electronically Signed   By: Sharlet Salina M.D.   On: 01/15/2021 03:38   DG CHEST PORT 1 VIEW  Result Date: 01/13/2021 CLINICAL DATA:  Endotracheal tube placement EXAM: PORTABLE CHEST 1 VIEW COMPARISON:  01/13/2021 FINDINGS: 1347 hours. Endotracheal tube tip is 4.6 cm above the base of the carina. A feeding tube passes into the stomach although the distal tip position is not included on the film. Right subclavian central line tip overlies the distal SVC level. Basilar  airspace disease noted, left greater than right, similar to prior. Possible small left effusion. Telemetry leads overlie the chest. IMPRESSION: 1. Endotracheal tube tip 4.6 cm above the base of the  carina. 2. Similar persistent asymmetric bibasilar airspace disease, left greater than right, with probable small left effusion. Electronically Signed   By: Kennith Center M.D.   On: 01/13/2021 14:10   DG CHEST PORT 1 VIEW  Result Date: 01/13/2021 CLINICAL DATA:  ET tube present.  Pneumonia.  Altered mental status. EXAM: PORTABLE CHEST 1 VIEW COMPARISON:  Jan 12, 2021. FINDINGS: Endotracheal tube tip projects at the level of the carina, extending slightly into the left main bronchus. Enteric feeding tube extends below the diaphragm and is looped in the expected region of the stomach. Similar position of a right subclavian approach catheter with the tip projecting at the superior cavoatrial junction. Similar left greater than right basilar airspace opacities. No visible pneumothorax or large pleural effusions on this limited single AP semi erect radiograph. Similar cardiomediastinal silhouette, accentuated by AP technique. IMPRESSION: 1. Endotracheal tube tip projects at the level of the carina, extending slightly into the left main bronchus. Recommend retraction by approximately 5 cm. 2. Similar left greater than right basilar airspace opacities, concerning for pneumonia. These results will be called to the ordering clinician or representative by the Radiologist Assistant, and communication documented in the PACS or [employer]. Electronically Signed   By: Feliberto Harts MD   On: 01/13/2021 12:26   VAS [place] TRANSCRANIAL DOPPLER  Result Date: 01/14/2021  Transcranial Doppler Patient Name:  Shawn Lane  Date of Exam:   01/14/2021 Medical Rec #: [id number]      Accession #:    [id number] Date of Birth: [birth year]      Patient Gender: M Patient Age:   60Y Exam Location:  [facility] Procedure:      VAS [place] TRANSCRANIAL DOPPLER Referring Phys: [id number] VINCENT J COSTELLA --------------------------------------------------------------------------------  Indications: Subarachnoid hemorrhage. Performing  Technologist: Ernestene Mention RDMS, RVT  Examination Guidelines: A complete evaluation includes B-mode imaging, spectral Doppler, color Doppler, and power Doppler as needed of all accessible portions of each vessel. Bilateral testing is considered an integral part of a complete examination. Limited examinations for reoccurring indications may be performed as noted.  +----------+-------------+----------+-----------+-------+ RIGHT TCD Right VM (cm)Depth (cm)PulsatilityComment +----------+-------------+----------+-----------+-------+ MCA          187.00       5.40      1.01            +----------+-------------+----------+-----------+-------+ ACA          -82.40       7.60      0.60            +----------+-------------+----------+-----------+-------+ Term ICA     210.00       6.20      0.93            +----------+-------------+----------+-----------+-------+ PCA          123.00       5.90      0.83            +----------+-------------+----------+-----------+-------+ Opthalmic     52.70       3.80      1.00            +----------+-------------+----------+-----------+-------+ ICA siphon   126.00       5.80      0.89            +----------+-------------+----------+-----------+-------+  Vertebral    -121.00      7.00      0.89            +----------+-------------+----------+-----------+-------+ Distal ICA    38.90       0.92       3.5            +----------+-------------+----------+-----------+-------+  +----------+------------+----------+-----------+-------+ LEFT TCD  Left VM (cm)Depth (cm)PulsatilityComment +----------+------------+----------+-----------+-------+ MCA          277.00      5.80      0.84            +----------+------------+----------+-----------+-------+ ACA          -66.30      7.40      0.84            +----------+------------+----------+-----------+-------+ Term ICA     102.00      7.00      1.02             +----------+------------+----------+-----------+-------+ PCA          63.20       6.20      0.82            +----------+------------+----------+-----------+-------+ Opthalmic    53.70       3.80      1.20            +----------+------------+----------+-----------+-------+ ICA siphon   48.60       5.40      1.45            +----------+------------+----------+-----------+-------+ Vertebral   -102.00      7.10      1.11            +----------+------------+----------+-----------+-------+ Distal ICA   69.50       4.20      0.93            +----------+------------+----------+-----------+-------+  +------------+-------+-------+             VM cm/sComment +------------+-------+-------+ Prox Basilar-103.00        +------------+-------+-------+ Dist Basilar-93.30         +------------+-------+-------+ +----------------------+----+ Right Lindegaard Ratio4.81 +----------------------+----+ +---------------------+----+ Left Lindegaard Ratio3.99 +---------------------+----+    Preliminary     Assessment/Plan: Events of last night noted: self-extubated, CPR with ROSC, iv ativan.  Now on pressors, sedated.  EEG ordered to r/o seizure.  Continue BP support to stated goals.  PICC line to be placed today.  Situation reviewed in presence of patient's cousin.  Appreciate CCM help.    LOS: 11 days    Dorian Heckle, MD 01/15/2021, 8:28 AM

## 2021-01-15 NOTE — Progress Notes (Signed)
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   Significant Hospital Events: Including procedures, antibiotic start and stop dates in addition to other pertinent events    On 5/17 presenting with headache, altered mental status. STAT CT head showed diffuse SAH w/ associated hydrocephalus.  He was seen by neuro surgical team w/ plan to proceed w/ ventriculostomy drain prior to surgical intervention. PCCM asked to assist w/care.  Started on unasyn for suspected aspiration.  Cultures sent.  R subclavian CVL and right aline  5/18-> Patient had extensor posturing last night, EVD was lowered from 15-10 and this morning it was lowered to 0, CSF is trending.  CT head was done last night which shows increasing hydrocephalus, despite EVD remain in good position and draining well.   TCD neg.  EEG neg  5/19 -> better neuro exam following commands L> R but still having abnormal extensor movements as well, EVD at 0, TCDs/ EEG neg, afebrile, remains on cleviprex, failed SBT due to bradypnea, not requiring sedation, went for successful pipeline shield embolization of RVA aneurysm   5/20 -> S/p successful pipeline shield embolization of RVA aneurysm 5/19 afternoon w/NSGY.  Fentanyl gtt added overnight by EMD for agitation, vent dyssynchrony and reaching for ETT both w/ UEs, currently at 50 mcg/hr.   Remains on cleviprex gtt at 14 mg/ hr, lisinopril added.  Following specific commands today, and no more posturing noted-> had epidsode of decreased responsive, CTH repeated, stable, expected  changes.  Vomited x 1- sats low 90s.  EVD remains 0, slightly sluggish overnight but now draining well, 118 ml/ 24hrs.  Afebrile, UOP 1.3L/ 24 hrs, trial of foley removal, +382 ml/ net - 714  5/23: Klebsiella sensitivities obtained; Meropenem changed to Ceftriaxone 2 g  IV 200 ml/hr. IVF 125 ml/hr  5/24 : Patient vomited x2, PRN zofran given. Patient with hiccups and not getting volumes on ventilator according to bedside nurse and RT. Ventilator switch out.  Thorazine 25 mg IV X 1 to improve hiccups.   5/25: SBP goal between 180-220.  Free water increased to 200 ml qhr.   5/26: Pt febrile with a tmax of 102.2. Fever likely neurogenic but given that he is being treated for aspiration pna, he will continued to be monitored for signs for infection. Procalcitonin baseline for <0.10; sepsis less likely. Arterial Line showing inaccurate readings; replaced.   Interim History / Subjective:   Overnight self-extubated, respiratory and then cardiac arrest. Emergently re-intubated.   Objective   Blood pressure (!) 198/87, pulse 76, temperature 99.6 F (37.6 C), temperature source Axillary, resp. rate (!) 0, weight 96.9 kg, SpO2 99 %.    Vent Mode: PRVC FiO2 (%):  [40 %-60 %] 50 % Set Rate:  [16 bmp-20 bmp] 20 bmp Vt Set:  [610 mL] 610 mL PEEP:  [5 cmH20-10 cmH20] 8 cmH20 Pressure Support:  [8 cmH20] 8 cmH20 Plateau Pressure:  [18 cmH20-21 cmH20] 18 cmH20   Intake/Output Summary (Last 24 hours) at 01/15/2021 1229 Last data filed at 01/15/2021 1200 Gross per 24 hour  Intake 4548.06 ml  Output 5234 ml  Net -685.94 ml   Filed Weights   01/11/21 0412 01/13/21 0451 01/14/21 0500  Weight: 96.1 kg 96.8 kg 96.9 kg    Examination: General:  Critically ill adult male lying in bed in NAD, intermittent hiccups HEENT: MM pink/moist, ETT/ cortrak, pupils 2/sluggish, some scleral edema, right frontal EVD at 0 Neuro: opens eyes to voice, f/c L > R(no movement to gravity) intermittently. Can move left arm  purposefully; right arm minimal movement.   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 Acute metabolic encephalopathy Hypertension  S/p  pipeline shield embolization of RVA aneurysm 5/19 - per NSGY SBP goal 180-220 - Cont norepinephrine,  Cont nimodipine 60mg  q 4hrs - cont keppra for seizure ppx - Cont EVD drainage open at 0: per neurosx - cont asa/plavix  - labetalol 10 mg prn for SBP >220  - avoid apresoline with SAH - holding bowel regimen  - further imaging per NSGY    - A-line likely more elevated than Cuff - consulting PT/OT  - guarded prognosis    Acute hypoxic respiratory failure  Klebsiella pna  self-extubation with respiratory arrest morning of 5/28 - Continue MV support, 4-8cc/kg IBW with goal Pplat <30 and DP<15  - VAP prevention protocol/ PPI - PAD protocol for sedation> continue low dose fentanyl for comfort. RASS goal 0/-1 -  FiO2 as able for SpO2 >92%  - continued attempts at sbt. With waxing and waning mental status concern about airway protection at this time or consistent ability to clear secretions. Discussed trach and peg likely in the future with patient's wife and mother. - worsening pulmonary edema over 8L positive. Will diurese with IV lasix 40mg  daily  Fever, Leukocytosis: - Aspiration/klebsiella pna 5/20 s/p ceftriaxone, -cont antipyretic and ctx. - Pct < 0.10,  slightly elevated WBC of 10.6; given this fever likely neurogenic - ongoing fever likely neurogenic 5/22 - ongoing tylenol and prn cooling blanket for fever - follow cultures- BC 1/3 w/GPC, suspected contamination - Central line removed 5/26, needs additional access, will place PICC - CBC w/ diff pending for 5/27  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 Demand cardiac ischemia TTE 5/18, EF 60-65%, normal RV, indeterminate diastolic, ? small regional wall motion abnormalities (mid anteroseptum).  Trop hs trend 251- 702- 825.  EKG non acute.  amio gtt stopped 5/18.   - remains NSR.  Continue tele - Mag goal > 2, K > 4  AKI - initially due to dehydration, then some acute urinary retention - now foley is out - improving renal function and good UOP - trend renal indices/ strict I/Os - Replace electrolytes as indicated - Avoid nephrotoxic agents, ensure adequate renal perfusion - Continue retention agents -- diuresis as above  Hypernatremia Hyperchloremic  - 5/28 144, improving -continue free H20 200 ml/ q6 - trend BMET in am  Diabetes, new diagnosis - A1c 6.5 - currently below  200  - continue SSI moderate - continue TF 7 units q 4hr - continue levemir 10 units BID  Normocytic anemia - H/H stable - trend CBC, transfuse for < 7 - Hb 9.9 5/25   Best practice (right click and "Reselect all SmartList Selections" daily)  Diet:  NPO,  TF 5/19, cortrak placed 5/20 Pain/Anxiety/Delirium protocol (if indicated): Yes (RASS goal 0) VAP protocol (if indicated): Yes DVT prophylaxis: SCD only  GI prophylaxis: PPI Glucose control:  SSI Yes Central venous access:  N/A placing picc today Arterial line:  Yes, and it is still needed Foley:  N/A Mobility:  bed rest  PT consulted: N/A Last date of multidisciplinary goals of care discussion [full scope of care, discussed with patient's wife at bedside 5/28. Discussed trach and PEG to give him chance for recovery. She has  questions about life looks like for him moving forward. Will consult palliative care for complex medical decision making] Code Status:  full code Disposition: ICU       The patient is critically ill due to respiratory failure, cardiac arrest, intracranial hemorrhage.  Critical care was necessary to treat or prevent imminent or life-threatening deterioration.  Critical care was time spent personally by me on the following activities: development of treatment plan with patient and/or surrogate as well as nursing, discussions with consultants, evaluation of patient's response to treatment, examination of patient, obtaining history from patient or surrogate, ordering and performing treatments and interventions, ordering and review of laboratory studies, ordering and review of radiographic studies, pulse oximetry, re-evaluation of patient's condition and participation in multidisciplinary rounds.   Critical Care Time devoted to patient care services described in this note is 45 minutes. This time reflects time of care of this signee Charlott Holler . This critical care time does not reflect separately billable procedures or procedure time, teaching time or supervisory time of PA/NP/Med student/Med Resident etc but could involve care discussion time.       Charlott Holler [facility] 01/15/2021 12:29 PM  Pager: see AMION  If no response to pager , please call critical care on call (see AMION) until 7pm After 7:00 pm call Elink

## 2021-01-15 NOTE — Progress Notes (Signed)
Code Blue. Patient responded to CPR   01/15/21 0400  Clinical Encounter Type  Visited With Health care provider  Visit Type Code  Referral From Nurse  Consult/Referral To Chaplain

## 2021-01-15 NOTE — Progress Notes (Signed)
PICC placed in LUE d/t RUE assessment with large amount of edema, RUE tight, cephalic vein non compressible and brachial and basilic difficult to visualize, at very bottom of the ultrasound screen set at 4.5 cm.  RN notified, recommend venous doppler to r/o DVT.  Skin pink and warm.  Susannah RN also notified to remove all PIV's not needed due to TL PICC ready to use.

## 2021-01-15 NOTE — Anesthesia Procedure Notes (Signed)
Procedure Name: Intubation Date/Time: 01/15/2021 3:04 AM Performed by: Zollie Scale, CRNA Pre-anesthesia Checklist: Patient identified, Emergency Drugs available, Suction available and Patient being monitored Patient Re-evaluated:Patient Re-evaluated prior to induction Oxygen Delivery Method: Ambu bag Preoxygenation: Pre-oxygenation with 100% oxygen Ventilation: Mask ventilation without difficulty Laryngoscope Size: Glidescope and 4 Grade View: Grade I Tube type: Oral Tube size: 7.5 mm Number of attempts: 1 Airway Equipment and Method: Stylet and Video-laryngoscopy Placement Confirmation: ETT inserted through vocal cords under direct vision,  breath sounds checked- equal and bilateral and CO2 detector Secured at: 24 cm Tube secured with: ICU securement device. Dental Injury: Teeth and Oropharynx as per pre-operative assessment  Comments: Responded to Code Blue in ICU, RT present at bedside with previous attempts at DL, VL performed by this CRNA, + intubation, tube secured with ICU device, transfer of patient care post airway securement to ICU team.

## 2021-01-16 ENCOUNTER — Inpatient Hospital Stay (HOSPITAL_COMMUNITY): Payer: 59

## 2021-01-16 DIAGNOSIS — I609 Nontraumatic subarachnoid hemorrhage, unspecified: Secondary | ICD-10-CM | POA: Diagnosis not present

## 2021-01-16 DIAGNOSIS — G931 Anoxic brain damage, not elsewhere classified: Secondary | ICD-10-CM

## 2021-01-16 DIAGNOSIS — Z7189 Other specified counseling: Secondary | ICD-10-CM

## 2021-01-16 DIAGNOSIS — Z515 Encounter for palliative care: Secondary | ICD-10-CM

## 2021-01-16 DIAGNOSIS — Z66 Do not resuscitate: Secondary | ICD-10-CM

## 2021-01-16 DIAGNOSIS — I7774 Dissection of vertebral artery: Secondary | ICD-10-CM | POA: Diagnosis not present

## 2021-01-16 DIAGNOSIS — I469 Cardiac arrest, cause unspecified: Secondary | ICD-10-CM | POA: Diagnosis not present

## 2021-01-16 DIAGNOSIS — J9601 Acute respiratory failure with hypoxia: Secondary | ICD-10-CM | POA: Diagnosis not present

## 2021-01-16 LAB — COMPREHENSIVE METABOLIC PANEL
ALT: 37 U/L (ref 0–44)
AST: 34 U/L (ref 15–41)
Albumin: 2 g/dL — ABNORMAL LOW (ref 3.5–5.0)
Alkaline Phosphatase: 64 U/L (ref 38–126)
Anion gap: 11 (ref 5–15)
BUN: 24 mg/dL — ABNORMAL HIGH (ref 8–23)
CO2: 26 mmol/L (ref 22–32)
Calcium: 8.1 mg/dL — ABNORMAL LOW (ref 8.9–10.3)
Chloride: 104 mmol/L (ref 98–111)
Creatinine, Ser: 1.1 mg/dL (ref 0.61–1.24)
GFR, Estimated: >60 mL/min (ref >60)
Glucose, Bld: 189 mg/dL — ABNORMAL HIGH (ref 70–99)
Potassium: 3.2 mmol/L — ABNORMAL LOW (ref 3.5–5.1)
Sodium: 141 mmol/L (ref 135–145)
Total Bilirubin: 0.5 mg/dL (ref 0.3–1.2)
Total Protein: 5.6 g/dL — ABNORMAL LOW (ref 6.5–8.1)

## 2021-01-16 LAB — CBC WITH DIFFERENTIAL/PLATELET
Abs Immature Granulocytes: 0.14 10*3/uL — ABNORMAL HIGH (ref 0.00–0.07)
Basophils Absolute: 0.1 10*3/uL (ref 0.0–0.1)
Basophils Relative: 0 %
Eosinophils Absolute: 0.2 10*3/uL (ref 0.0–0.5)
Eosinophils Relative: 1 %
HCT: 29.8 % — ABNORMAL LOW (ref 39.0–52.0)
Hemoglobin: 9.6 g/dL — ABNORMAL LOW (ref 13.0–17.0)
Immature Granulocytes: 1 %
Lymphocytes Relative: 10 %
Lymphs Abs: 1.7 10*3/uL (ref 0.7–4.0)
MCH: 28.6 pg (ref 26.0–34.0)
MCHC: 32.2 g/dL (ref 30.0–36.0)
MCV: 88.7 fL (ref 80.0–100.0)
Monocytes Absolute: 1.4 10*3/uL — ABNORMAL HIGH (ref 0.1–1.0)
Monocytes Relative: 8 %
Neutro Abs: 14.2 10*3/uL — ABNORMAL HIGH (ref 1.7–7.7)
Neutrophils Relative %: 80 %
Platelets: 332 10*3/uL (ref 150–400)
RBC: 3.36 MIL/uL — ABNORMAL LOW (ref 4.22–5.81)
RDW: 12.8 % (ref 11.5–15.5)
WBC: 17.7 10*3/uL — ABNORMAL HIGH (ref 4.0–10.5)
nRBC: 0 % (ref 0.0–0.2)

## 2021-01-16 LAB — GLUCOSE, CAPILLARY
Glucose-Capillary: 165 mg/dL — ABNORMAL HIGH (ref 70–99)
Glucose-Capillary: 169 mg/dL — ABNORMAL HIGH (ref 70–99)

## 2021-01-16 LAB — TRIGLYCERIDES: Triglycerides: 160 mg/dL — ABNORMAL HIGH (ref <150)

## 2021-01-16 LAB — MAGNESIUM: Magnesium: 2.1 mg/dL (ref 1.7–2.4)

## 2021-01-16 MED ORDER — BIOTENE DRY MOUTH MT LIQD: 15.0000 mL | OROMUCOSAL | Status: DC | PRN

## 2021-01-16 MED ORDER — GLYCOPYRROLATE 0.2 MG/ML IJ SOLN: 0.2000 mg | INTRAMUSCULAR | Status: DC | PRN

## 2021-01-16 MED ORDER — ACETAMINOPHEN 325 MG PO TABS: 650.0000 mg | ORAL_TABLET | Freq: Four times a day (QID) | ORAL | Status: DC | PRN

## 2021-01-16 MED ORDER — GLYCOPYRROLATE 1 MG PO TABS
1.0000 mg | ORAL_TABLET | ORAL | Status: DC | PRN
Start: 2021-01-16 — End: 2021-01-16
  Filled 2021-01-16: qty 1

## 2021-01-16 MED ORDER — ACETAMINOPHEN 650 MG RE SUPP: 650.0000 mg | Freq: Four times a day (QID) | RECTAL | Status: DC | PRN

## 2021-01-16 MED ORDER — HALOPERIDOL LACTATE 5 MG/ML IJ SOLN: 0.5000 mg | INTRAMUSCULAR | Status: DC | PRN

## 2021-01-16 MED ORDER — HALOPERIDOL LACTATE 2 MG/ML PO CONC
0.5000 mg | ORAL | Status: DC | PRN
  Filled 2021-01-16: qty 0.3

## 2021-01-16 MED ORDER — HALOPERIDOL 0.5 MG PO TABS
0.5000 mg | ORAL_TABLET | ORAL | Status: DC | PRN
  Filled 2021-01-16: qty 1

## 2021-01-16 MED ORDER — POLYVINYL ALCOHOL 1.4 % OP SOLN: 1.0000 [drp] | Freq: Four times a day (QID) | OPHTHALMIC | Status: DC | PRN

## 2021-01-19 NOTE — Progress Notes (Signed)
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   Significant Hospital Events: Including procedures, antibiotic start and stop dates in addition to other pertinent events    On 5/17 presenting with headache, altered mental status. STAT CT head showed diffuse SAH w/ associated hydrocephalus.  He was seen by neuro surgical team w/ plan to proceed w/ ventriculostomy drain prior to surgical intervention. PCCM asked to assist w/care.  Started on unasyn for suspected aspiration.  Cultures sent.  R subclavian CVL and right aline  5/18-> Patient had extensor posturing last night, EVD was lowered from 15-10 and this morning it was lowered to 0, CSF is trending.  CT head was done last night which shows increasing hydrocephalus, despite EVD remain in good position and draining well.   TCD neg.  EEG neg  5/19 -> better neuro exam following commands L> R but still having abnormal extensor movements as well, EVD at 0, TCDs/ EEG neg, afebrile, remains on cleviprex, failed SBT due to bradypnea, not requiring sedation, went for successful pipeline shield embolization of RVA aneurysm   5/20 -> S/p successful pipeline shield embolization of RVA aneurysm 5/19 afternoon w/NSGY.  Fentanyl gtt added overnight by EMD for agitation, vent dyssynchrony and reaching for ETT both w/ UEs, currently at 50 mcg/hr.   Remains on cleviprex gtt at 14 mg/ hr, lisinopril added.  Following specific commands today, and no more posturing noted-> had epidsode of decreased responsive, CTH repeated, stable, expected  changes.  Vomited x 1- sats low 90s.  EVD remains 0, slightly sluggish overnight but now draining well, 118 ml/ 24hrs.  Afebrile, UOP 1.3L/ 24 hrs, trial of foley removal, +382 ml/ net - 714  5/23: Klebsiella sensitivities obtained; Meropenem changed to Ceftriaxone 2 g  IV 200 ml/hr. IVF 125 ml/hr  5/24 : Patient vomited x2, PRN zofran given. Patient with hiccups and not getting volumes on ventilator according to bedside nurse and RT. Ventilator switch out.  Thorazine 25 mg IV X 1 to improve hiccups.   5/25: SBP goal between 180-220.  Free water increased to 200 ml qhr.   5/26: Pt febrile with a tmax of 102.2. Fever likely neurogenic but given that he is being treated for aspiration pna, he will continued to be monitored for signs for infection. Procalcitonin baseline for <0.10; sepsis less likely. Arterial Line showing inaccurate readings; replaced.   5/27: Overnight self-extubated, respiratory and then cardiac arrest. Emergently re-intubated.  Interim History / Subjective:    worsening mental status changes overnight. CT head shows re-bleeding.   Objective   Blood pressure 128/67, pulse 83, temperature (!) 101.8 F (38.8 C), temperature source Axillary, resp. rate (!) 25, weight 93 kg, SpO2 98 %.    Vent Mode: Stand-by FiO2 (%):  [40 %] 40 % Set Rate:  [16 bmp-20 bmp] 16 bmp Vt Set:  [610 mL] 610 mL PEEP:  [8 cmH20] 8 cmH20 Plateau Pressure:  [17 cmH20-19 cmH20] 19 cmH20   Intake/Output Summary (Last 24 hours) at Jan 19, 2021 1134 Last data filed at 01-19-2021 1000 Gross per 24 hour  Intake  5888.3 ml  Output 5865 ml  Net 23.3 ml   Filed Weights   01/13/21 0451 01/14/21 0500 02-02-21 0400  Weight: 96.8 kg 96.9 kg 93 kg    Examination: General:  Critically ill man, intubated.  HEENT: ETT to vent, EVD in place Neuro: absent brain stem reflexes, can over breath on the ventilator. 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 Now with worsening vertebral artery dissection causing rebleeding and poor neuro exam. Family considering transition to comfort measures due to lower likelihood for meaningful recovery. - per NSGY SBP goal 180-220 - Cont norepinephrine,  Cont nimodipine 60mg  q 4hrs - cont keppra for seizure ppx - Cont EVD drainage open at 0: per neurosx - cont asa/plavix  - labetalol 10 mg prn for SBP >220  - avoid apresoline with SAH - holding bowel regimen  - further imaging per NSGY    - A-line likely more elevated than Cuff - consulting PT/OT  - guarded prognosis    Acute hypoxic respiratory failure  Klebsiella pna  self-extubation with respiratory arrest morning of 5/28 followed by cardiac arrest.  - Continue MV support, 4-8cc/kg IBW with goal Pplat <30 and DP<15  - VAP prevention protocol/ PPI - PAD protocol for sedation> continue low dose fentanyl for comfort. RASS goal 0/-1 -  FiO2 as able for SpO2 >92%  - initially   Fever, Leukocytosis: - Aspiration/klebsiella pna 5/20 s/p ceftriaxone, -cont antipyretic and ctx. - suspect neurogenic fevers, leukocytosis likely reactive  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 Demand cardiac ischemia Cardiac Arrest in the setting of hypoxemia from self-extubation TTE 5/18, EF 60-65%, normal RV, indeterminate diastolic, ? small regional wall motion abnormalities  (mid anteroseptum).  Trop hs trend 251- 702- 825.  EKG non acute.  amio gtt stopped 5/18.   - remains NSR.  Continue tele - Mag goal > 2, K > 4  AKI - initially due to dehydration, then some acute urinary retention - now foley is out - improving renal function and good UOP - trend renal indices/ strict I/Os - Replace electrolytes as indicated - Avoid nephrotoxic agents, ensure adequate renal perfusion - Continue retention agents -- diuresis as above  Hypernatremia Hyperchloremic  -continue free H20 200 ml/ q6 - trend BMET in am  Diabetes, new diagnosis - A1c 6.5 - currently below 200  - continue SSI moderate - continue TF 7 units q 4hr - continue levemir 10 units BID  Normocytic anemia - H/H stable - trend CBC, transfuse for < 7 - Hb 9.9 5/25   Best practice (right click and "Reselect all SmartList Selections" daily)  Diet:  NPO,  TF 5/19, cortrak placed 5/20 Pain/Anxiety/Delirium protocol (if indicated): Yes (RASS goal 0) VAP protocol (if indicated): Yes DVT prophylaxis: SCD only  GI prophylaxis: PPI Glucose control:  SSI Yes Central venous access:  N/A  Arterial line:  Yes, and it is still needed  Foley:  N/A Mobility:  bed rest  PT consulted: N/A Last date of multidisciplinary goals of care discussion spoke with patient's wife and mother at bedside. Due to devastating nature of worsening dissection and poor exam, transitioning to comfort measures.  Code Status:  DNR Disposition: ICU       The patient is critically ill due to respiratory failure, cardiac arrest, intracranial hemorrhage.  Critical care was necessary to treat or prevent imminent or life-threatening deterioration.  Critical care was time spent personally by me on the following activities: development of treatment plan with patient and/or surrogate as well as nursing, discussions with consultants, evaluation of patient's response to treatment, examination of patient, obtaining history from patient or  surrogate, ordering and performing treatments and interventions, ordering and review of laboratory studies, ordering and review of radiographic studies, pulse oximetry, re-evaluation of patient's condition and participation in multidisciplinary rounds.   Critical Care Time devoted to patient care services described in this note is 37 minutes. This time reflects time of care of this signee Charlott Holler . This critical care time does not reflect separately billable procedures or procedure time, teaching time or supervisory time of PA/NP/Med student/Med Resident etc but could involve care discussion time.       Charlott Holler [facility] Feb 10, 2021 11:34 AM  Pager: see AMION  If no response to pager , please call critical care on call (see AMION) until 7pm After 7:00 pm call Elink

## 2021-01-19 NOTE — Progress Notes (Signed)
PT Cancellation Note  Patient Details Name: Shawn Lane MRN: [id number] DOB: [birth year]   Cancelled Treatment:    Reason Eval/Treat Not Completed: Medical issues which prohibited therapy;Patient's level of consciousness. Pt with medical decline and not appropriate for PT eval at this time. PT will sign off.   Raymond Gurney, PT, DPT Acute Rehabilitation Services  Pager: [phone] Office: [phone]    Jewel Baize 12/29/2020, 10:48 AM

## 2021-01-19 NOTE — Discharge Summary (Signed)
Discharge Summary  Date of Admission: 12/27/2020  Date of Discharge: 07-Feb-2021  Attending Physician: [name], MD  Hospital Course: Patient presented with obtundation, found to have aneurysmal SAH, hydrocephalus, EVD was placed, was initially high grade but his exam improved enough to be treated, on 5/19 had pipeline embo of the vert aneurysm. Had respiratory failure due to aspiration pneumonia from klebsiella, treated with antibiotics. He had symptomatic vasospasm with right sided weakness, treated with HHH therapy. On 5/28 he self-extubated and arrested with ROSC, was reintubated. Repeat CTH x2 showed new SAH concerning for re-rupture. Neurologic exam remained poor post-arrest with no brainstem reflexes but overbreathing the vent. This was discussed w/ his family, they wished to proceed with terminal extubation, which was performed on 2023/02/08. He died on 2021-02-07 at 11:38, family aware at bedside.   Discharge diagnosis: Aneurysmal subarachnoid hemorrhage  Jadene Pierini, MD 02-07-21 10:57 AM

## 2021-01-19 NOTE — Procedures (Addendum)
Patient Name: Shawn Lane  MRN: [id number]  Epilepsy Attending: Charlsie Quest  Referring Physician/Provider: Tessie Fass, NP Duration: 01/15/2021 0922 to 02/15/2021 1103  Patient history: 60 y.o. male with a history of SAH and cardiac arrest who is undergoing an EEG to evaluate for seizures.  Level of alertness:  comatose  AEDs during EEG study: LEV  Technical aspects: This EEG study was done with scalp electrodes positioned according to the 10-20 International system of electrode placement. Electrical activity was acquired at a sampling rate of 500Hz  and reviewed with a high frequency filter of 70Hz  and a low frequency filter of 1Hz . EEG data were recorded continuously and digitally stored.   Description: EEG initially showed continuous generalized 5-9hz  theta-alpha activity as well as intermittent 2-3hz  delta slowing. There was also sharply contoured 3-5hz  theta-delta slowing with sharp transients noted in left frontal region consistent with breach artifact. At 2121 on 01/15/2021, eeg suddenly worsened and showed generalized eeg attenuation and eventually eeg suppression. Hyperventilation and photic stimulation were not performed.     ABNORMALITY - Breach artifact, left frontal region - Continuous slow, generalized - Background suppression, generalized   IMPRESSION: This study was initially suggestive of cortical dysfunction in left frontal region dur to underlying craniotomy as well as severe diffuse encephalopathy, non specific etiology. After 2121 on 01/15/2021, EEg worsed and showed profound diffuse encephalopathy, non specific etiology.   Miqueas Whilden 01/17/2021

## 2021-01-19 NOTE — Progress Notes (Signed)
Keep SBP around 160-180 per neurosurgery.

## 2021-01-19 NOTE — Progress Notes (Signed)
Pt extubated at 1128. Family at the bedside  Asystole on the monitor at 1138.  Two nurses verified absence of heart and lung sounds.

## 2021-01-19 NOTE — Progress Notes (Signed)
RN walked into to patient's room and the patient began extensive posturing, pupils were blown and nonreactive, CCM was on the floor and came to bedside, neurosurgery was notified, patient taken down for a stat head CT. Reference CCM note.

## 2021-01-19 NOTE — Progress Notes (Signed)
Neurosurgery Service Progress Note  Subjective: Events reviewed, pt self-extubated, coded, had a rpt CTH without significant change then another rpt CTH with increased hemorrhage through the cisterns with some SDH vs redistribution    Objective: Vitals:   02/13/2021 0800 02/13/21 0820 13-Feb-2021 0900 02-13-2021 1000  BP: (!) 178/76  (!) 160/72 128/67  Pulse: 89  87 83  Resp: (!) 41  (!) 24 (!) 25  Temp:  (!) 101.8 F (38.8 C)    TempSrc:  Axillary    SpO2: 98% 99% 97% 98%  Weight:        Physical Exam: Pupils fixed, dilated OS 21mm and pinpoint OD, no corneals/cough/gag, +overbreathing the vent, no response to painful stim x4  Assessment & Plan: 60 y.o. man w/ likely dissecting vert aneurysm ruptured s/p pipeline, was high grade and improved, then self-extubation and coded then likely re-hemorrhage.  -no brainstem reflexes, but still over-breathing vent. Discussed w/ the patient's wife, likely aneurysm rehemorrhage / dissection extension or trauma from resuscitation, but either way very poor exam, she wishes to proceed with terminal extubation at this point, which I think is very reasonable, will place palliative order set and extubation order   Jadene Pierini  02-13-2021 10:51 AM

## 2021-01-19 NOTE — Progress Notes (Signed)
EEG discontinued, no skin break seen. 

## 2021-01-19 NOTE — Progress Notes (Signed)
OT Cancellation and Discharge  Note  Patient Details Name: JAICE DIGIOIA MRN: [id number] DOB: 1961/04/25   Cancelled Treatment:    Reason Eval/Treat Not Completed: Medical issues which prohibited therapy.  Events noted.  OT will sign off at this time.  Eber Jones., OTR/L Acute Rehabilitation Services Pager [phone] Office [phone]   Jeani Hawking M Feb 01, 2021, 7:49 AM

## 2021-01-19 NOTE — Progress Notes (Signed)
eLink Physician-Brief Progress Note Patient Name: Shawn Lane DOB: 09/18/1960 MRN: [id number]   Date of Service  12/19/2020  HPI/Events of Note  Patient with catastrophic new ICH on follow up CT imaging.  eICU Interventions  Per Neurosurgery we are NOT to start 3 % saline. Abysmal prognosis has been communicated to patient's spouse previously.        Shawn Lane 01/04/2021, 4:04 AM

## 2021-01-19 NOTE — Consult Note (Signed)
Consultation Note Date: February 07, 2021   Patient Name: Shawn Lane  DOB: [birth year]  MRN: [id number]  Age / Sex: 60 y.o., male  PCP: [facility] Referring Physician: Consuella Lose, MD  Reason for Consultation: Establishing goals of care and Psychosocial/spiritual support  HPI/Patient Profile: 60 y.o. male   admitted on 12/29/2020 with several days of headaches  Came to ER.  CT head negative.  Developed progressive headache and back to ER.  Developed loss of consciousness and required intubation.  CT head now showing SAH with hydrocephalus.    60 year old male who first presented to the ER 5/16 w/ cc: worse head ache of life. Reported on 5/14 at [facility] tournament received a blow to his left jaw and below the skull just above the right ear. No pain immediately after. Had HA on 5/15 w/ slight right sided HA that was progressive. then Woke up with worst head ache of life on 5/16. Went to urgent care & was referred to ER for CT brain and C spine which was completely normal.   On 5/17 called EMS w/ worsening HA. On EMS arrival diaphoretic and in acute distress. While on way to ED complained of worsening HA and neck pain w/ noted hypertension. Became unresponsive and stopped breathing requiring assisted ventilation. He was intubated. By EDP on arrival to ER. STAT CT head showed diffuse SAH w/ associated hydrocephalus. Initial neuro exam was very minimal. Had cough and decorticate posturing of LEs w/ stimulation. He was seen by neuro surgical team w/ plan to proceed w/ ventriculostomy drain prior to surgical intervention.   On 5/28 he self-extubated and arrested with ROSC, was reintubated. Repeat CTH x2 showed new [place] concerning for re-rupture. Neurologic exam remained poor post-arrest with no brainstem reflexes but overbreathing the vent.   Day 9 of this hospitalization    Family face treatment  option decisions, advanced directive decisions.   Clinical Assessment and Goals of Care:   This NP Wadie Lessen reviewed medical records, received report from team, assessed the patient and then meet briefly  at the patient's bedside along with his wife to discuss  EOL wishes and options.   Concept of Palliative Care was introduced as specialized medical care for people and their families living with serious illness.   Values and goals of care important to patient and family were attempted to be elicited.  Created space and opportunity for patient  and family to explore thoughts and feelings regarding current medical situation  Family has made decision for terminal extubation allowing for natural death.  Recommendations to attending team for symptom management.    Emotional support offered.  Education offered on grief and bereavement services through area hospice.   PMT will continue to support holistically.            Primary Diagnoses: Present on Admission: **None**   I have reviewed the medical record, interviewed the patient and family, and examined the patient. The following aspects are pertinent.  Past Medical History:  Diagnosis Date  . Arthritis   . Wears glasses    Social History   Socioeconomic History  . Marital status: Married    Spouse name: Not on file  . Number of children: Not on file  . Years of education: Not on file  . Highest education level: Not on file  Occupational History  . Not on file  Tobacco Use  . Smoking status: Former Smoker    Quit date: 11/05/1995  Years since quitting: 25.2  . Smokeless tobacco: Never Used  Vaping Use  . Vaping Use: Never used  Substance and Sexual Activity  . Alcohol use: Yes    Comment: occ-weekends  . Drug use: No  . Sexual activity: Not on file  Other Topics Concern  . Not on file  Social History Narrative  . Not on file   Social Determinants of Health   Financial Resource Strain: Not on file   Food Insecurity: Not on file  Transportation Needs: Not on file  Physical Activity: Not on file  Stress: Not on file  Social Connections: Not on file   Family History  Problem Relation Age of Onset  . Allergic rhinitis Neg Hx   . Angioedema Neg Hx   . Asthma Neg Hx   . Eczema Neg Hx   . Immunodeficiency Neg Hx   . Urticaria Neg Hx    Scheduled Meds: .  stroke: mapping our early stages of recovery book   Does not apply Once  . bethanechol  5 mg Per Tube TID  . chlorhexidine gluconate (MEDLINE KIT)  15 mL Mouth Rinse BID  . Chlorhexidine Gluconate Cloth  6 each Topical Daily  . feeding supplement (PROSource TF)  45 mL Per Tube TID  . fiber  1 packet Per Tube BID  . free water  200 mL Per Tube Q6H  . furosemide  40 mg Intravenous Daily  . insulin aspart  0-15 Units Subcutaneous Q4H  . insulin aspart  7 Units Subcutaneous Q4H  . insulin detemir  10 Units Subcutaneous BID  . levETIRAcetam  500 mg Per Tube BID  . mouth rinse  15 mL Mouth Rinse 10 times per day  . midazolam  2 mg Intravenous Once  . niMODipine  60 mg Per Tube Q4H   Or  . niMODipine  60 mg Oral Q4H  . pantoprazole  40 mg Oral Daily   Or  . pantoprazole sodium  40 mg Per Tube Daily  . sodium chloride flush  10-40 mL Intracatheter Q12H   Continuous Infusions: . sodium chloride 0 mL (01/05/21 0938)  . sodium chloride    . sodium chloride Stopped (2021/02/15 0551)  . feeding supplement (VITAL 1.5 CAL) 1,000 mL (01/15/21 1621)  . fentaNYL infusion INTRAVENOUS 50 mcg/hr (February 15, 2021 0700)  . norepinephrine (LEVOPHED) Adult infusion 5 mcg/min (02/15/2021 0600)  . phenylephrine (NEO-SYNEPHRINE) Adult infusion Stopped (02-15-21 0131)  . piperacillin-tazobactam 12.5 mL/hr at 15-Feb-2021 0700  . propofol (DIPRIVAN) infusion Stopped (01/15/21 0945)   PRN Meds:.acetaminophen **OR** acetaminophen (TYLENOL) oral liquid 160 mg/5 mL **OR** acetaminophen, docusate, fentaNYL, hydrALAZINE, labetalol, LORazepam, ondansetron **OR**  ondansetron (ZOFRAN) IV, sodium chloride flush Medications Prior to Admission:  Prior to Admission medications   Medication Sig Start Date End Date Taking? Authorizing Provider  meloxicam (MOBIC) 15 MG tablet Take 15 mg by mouth daily. 12/26/2020  Yes [provider]  Multiple Vitamins-Minerals (MULTIVITAMIN WITH MINERALS) tablet Take 1 tablet by mouth daily.   Yes [provider]  traMADol (ULTRAM) 50 MG tablet Take 50 mg by mouth every 6 (six) hours as needed for moderate pain (headache).   Yes [provider]  Fluticasone Propionate (XHANCE) 93 MCG/ACT EXHU Place 2 puffs into the nose 2 (two) times daily. Patient not taking: Reported on 01/05/2021 03/20/17   Bobbitt, Sedalia Muta, MD  levocetirizine Harlow Ohms) 5 MG tablet Take 1 tablet (5 mg total) by mouth every evening. 03/20/17   Bobbitt, Sedalia Muta, MD   No  Known Allergies Review of Systems  Unable to perform ROS: Acuity of condition    Physical Exam Constitutional:      Appearance: He is normal weight. He is ill-appearing.     Interventions: He is intubated.  Cardiovascular:     Rate and Rhythm: Normal rate.  Pulmonary:     Effort: He is intubated.     Vital Signs: BP (!) 175/75   Pulse 90   Temp 98.3 F (36.8 C) (Oral)   Resp (!) 31   Wt 93 kg   SpO2 98%   BMI 28.20 kg/m  Pain Scale: CPOT       SpO2: SpO2: 98 % O2 Device:SpO2: 98 % O2 Flow Rate: .   IO: Intake/output summary:   Intake/Output Summary (Last 24 hours) at 01/18/21 0809 Last data filed at Jan 18, 2021 0700 Gross per 24 hour  Intake 5882.97 ml  Output 6767 ml  Net -884.03 ml    LBM: Last BM Date: 01/15/21 Baseline Weight: Weight: 93.6 kg Most recent weight: Weight: 93 kg     Palliative Assessment/Data:   Discussed with Dr Shearon Stalls  Time In: 0900 Time Out: 0945 Time Total: 45 minutes Greater than 50%  of this time was spent counseling and coordinating care related to the above assessment and plan.  Signed by: Wadie Lessen, NP   Please contact Palliative Medicine Team phone at [phone] for questions and concerns.  For individual provider: See Shea Evans

## 2021-01-19 DEATH — deceased

## 2021-01-20 MED FILL — Medication: Qty: 1 | Status: AC

## 2021-10-17 IMAGING — DX DG ABD PORTABLE 1V
1 series · 1 of 1 positions shown · non-contrast
Comparison: None

CLINICAL DATA: Feeding tube placement

EXAM:
PORTABLE ABDOMEN - 1 VIEW

[abdomen kub]
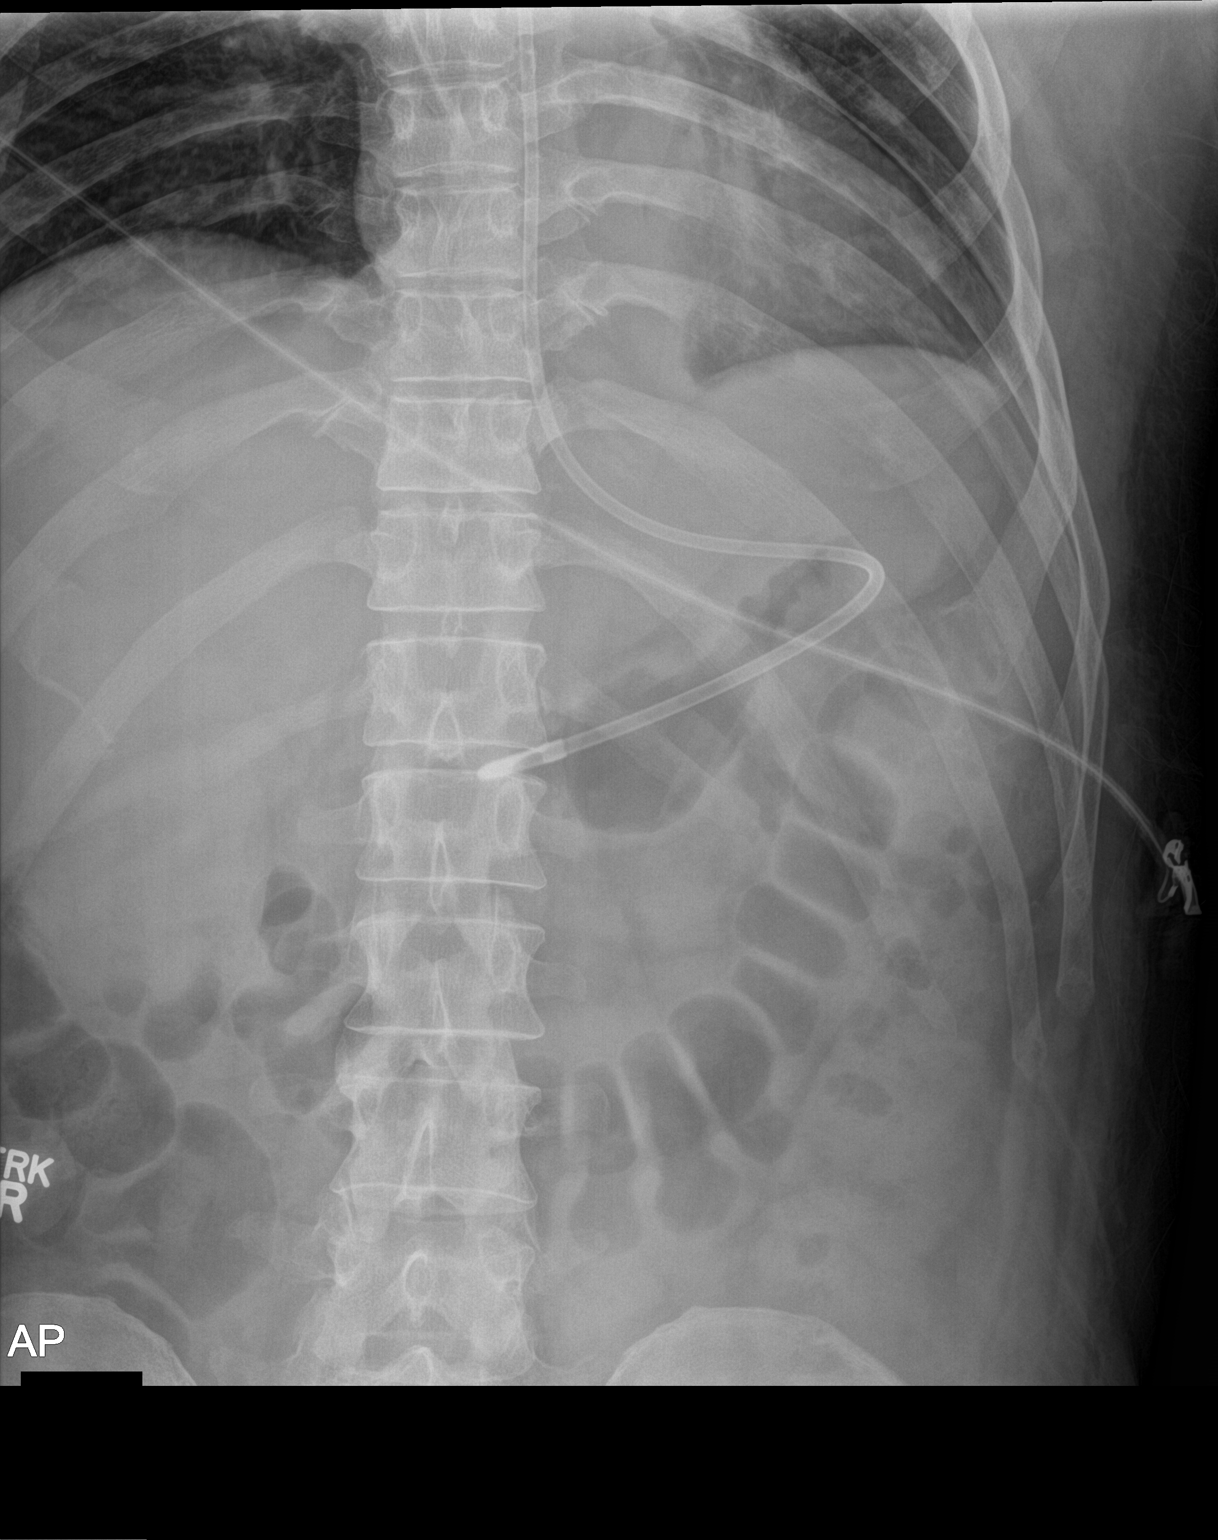

[1 of 1 positions shown; findings below may reference images not displayed]

FINDINGS: Feeding tube tip is at the distal stomach. There is mild stool
volume in the colon. There is no bowel dilatation or air-fluid level
to suggest bowel obstruction. No free air. Lung bases clear.
IMPRESSION: Feeding tube tip at distal stomach. No bowel obstruction or free air
evident. Lung bases clear.

## 2021-10-23 IMAGING — DX DG CHEST 1V PORT
1 series · 1 of 1 positions shown · non-contrast
Comparison: January 12, 2021.

CLINICAL DATA: ET tube present.  Pneumonia.  Altered mental status.

EXAM:
PORTABLE CHEST 1 VIEW

[chest]
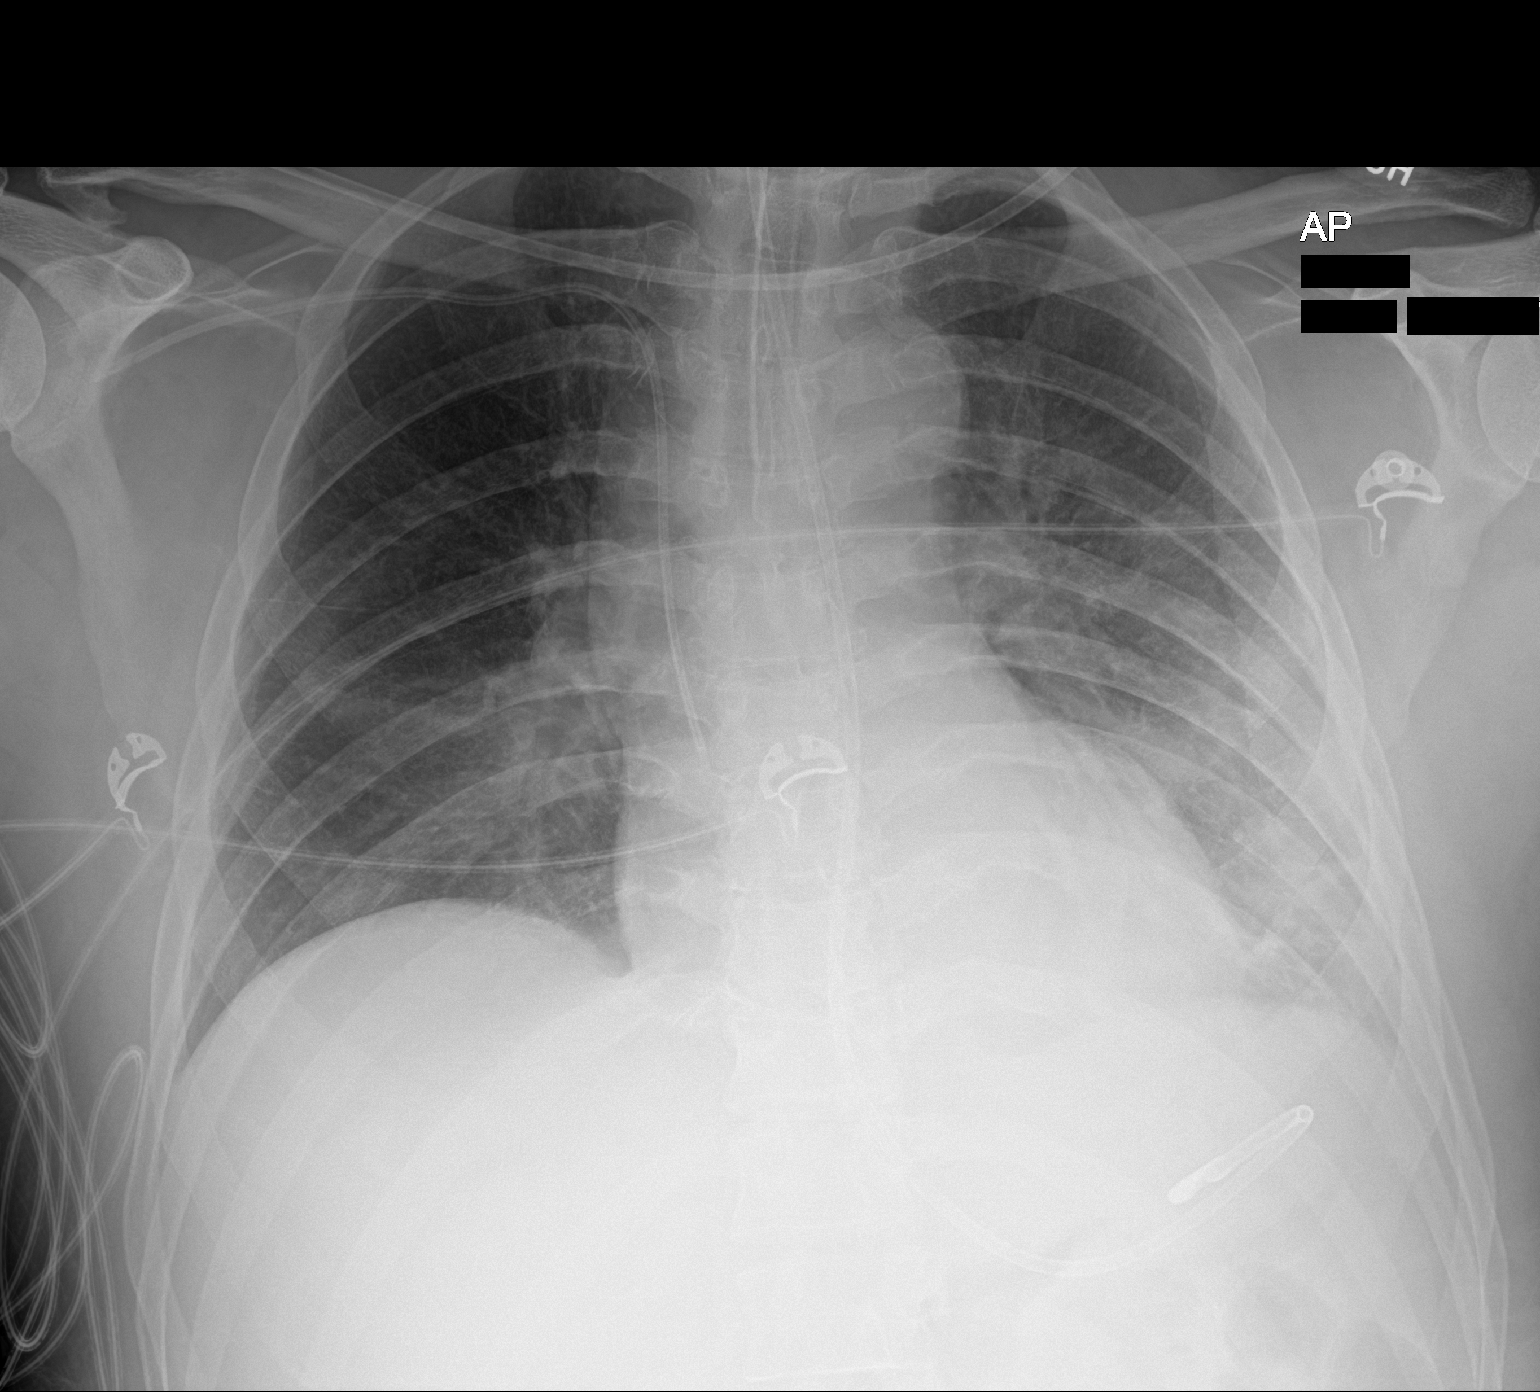

[1 of 1 positions shown; findings below may reference images not displayed]

FINDINGS: Endotracheal tube tip projects at the level of the carina, extending
slightly into the left main bronchus. Enteric feeding tube extends
below the diaphragm and is looped in the expected region of the
stomach. Similar position of a right subclavian approach catheter
with the tip projecting at the superior cavoatrial junction. Similar
left greater than right basilar airspace opacities. No visible
pneumothorax or large pleural effusions on this limited single AP
semi erect radiograph. Similar cardiomediastinal silhouette,
accentuated by AP technique.
IMPRESSION: 1. Endotracheal tube tip projects at the level of the carina,
extending slightly into the left main bronchus. Recommend retraction
by approximately 5 cm.
2. Similar left greater than right basilar airspace opacities,
concerning for pneumonia.

These results will be called to the ordering clinician or
representative by the Radiologist Assistant, and communication
documented in the PACS or [REDACTED].
